# Patient Record
Sex: Female | Born: 1940
Health system: Southern US, Community
[De-identification: ages and names within clinical notes are randomized; demographics above are authoritative.]

## PROBLEM LIST (undated history)

## (undated) DIAGNOSIS — I10 Essential (primary) hypertension: Secondary | ICD-10-CM

## (undated) DIAGNOSIS — I471 Supraventricular tachycardia, unspecified: Secondary | ICD-10-CM

## (undated) DIAGNOSIS — R42 Dizziness and giddiness: Secondary | ICD-10-CM

## (undated) DIAGNOSIS — M1990 Unspecified osteoarthritis, unspecified site: Secondary | ICD-10-CM

## (undated) DIAGNOSIS — E079 Disorder of thyroid, unspecified: Secondary | ICD-10-CM

## (undated) DIAGNOSIS — Z9889 Other specified postprocedural states: Secondary | ICD-10-CM

## (undated) DIAGNOSIS — F329 Major depressive disorder, single episode, unspecified: Secondary | ICD-10-CM

## (undated) DIAGNOSIS — G459 Transient cerebral ischemic attack, unspecified: Secondary | ICD-10-CM

## (undated) DIAGNOSIS — F29 Unspecified psychosis not due to a substance or known physiological condition: Secondary | ICD-10-CM

## (undated) DIAGNOSIS — E785 Hyperlipidemia, unspecified: Secondary | ICD-10-CM

## (undated) DIAGNOSIS — K219 Gastro-esophageal reflux disease without esophagitis: Secondary | ICD-10-CM

## (undated) DIAGNOSIS — E119 Type 2 diabetes mellitus without complications: Secondary | ICD-10-CM

## (undated) HISTORY — DX: Unspecified psychosis not due to a substance or known physiological condition: F29

## (undated) HISTORY — PX: COLONOSCOPY: SHX174

## (undated) HISTORY — PX: TUBAL LIGATION: SHX77

## (undated) HISTORY — DX: Transient cerebral ischemic attack, unspecified: G45.9

## (undated) HISTORY — DX: Major depressive disorder, single episode, unspecified: F32.9

## (undated) HISTORY — PX: JOINT REPLACEMENT: SHX530

## (undated) HISTORY — PX: THYROIDECTOMY: SHX17

## (undated) HISTORY — PX: CATARACT EXTRACTION: SUR2

## (undated) HISTORY — DX: Gastro-esophageal reflux disease without esophagitis: K21.9

## (undated) HISTORY — PX: ROTATOR CUFF REPAIR: SHX139

## (undated) HISTORY — DX: Essential (primary) hypertension: I10

---

## 2009-08-27 ENCOUNTER — Encounter: Admission: RE | Admit: 2009-08-27 | Discharge: 2009-10-24 | Payer: Self-pay | Admitting: Family Medicine

## 2010-04-01 ENCOUNTER — Emergency Department (HOSPITAL_BASED_OUTPATIENT_CLINIC_OR_DEPARTMENT_OTHER)
Admission: EM | Admit: 2010-04-01 | Discharge: 2010-04-01 | Payer: Self-pay | Source: Home / Self Care | Admitting: Emergency Medicine

## 2011-05-14 ENCOUNTER — Emergency Department (INDEPENDENT_AMBULATORY_CARE_PROVIDER_SITE_OTHER): Payer: Medicare Other

## 2011-05-14 ENCOUNTER — Emergency Department (HOSPITAL_BASED_OUTPATIENT_CLINIC_OR_DEPARTMENT_OTHER)
Admission: EM | Admit: 2011-05-14 | Discharge: 2011-05-14 | Disposition: A | Discharge status: Home / Self Care | Payer: Medicare Other | Attending: Emergency Medicine | Admitting: Emergency Medicine

## 2011-05-14 ENCOUNTER — Encounter: Payer: Self-pay | Admitting: *Deleted

## 2011-05-14 ENCOUNTER — Other Ambulatory Visit: Payer: Self-pay

## 2011-05-14 DIAGNOSIS — E079 Disorder of thyroid, unspecified: Secondary | ICD-10-CM | POA: Insufficient documentation

## 2011-05-14 DIAGNOSIS — R42 Dizziness and giddiness: Secondary | ICD-10-CM | POA: Insufficient documentation

## 2011-05-14 DIAGNOSIS — Z79899 Other long term (current) drug therapy: Secondary | ICD-10-CM | POA: Insufficient documentation

## 2011-05-14 DIAGNOSIS — I10 Essential (primary) hypertension: Secondary | ICD-10-CM

## 2011-05-14 DIAGNOSIS — F430 Acute stress reaction: Secondary | ICD-10-CM

## 2011-05-14 HISTORY — DX: Essential (primary) hypertension: I10

## 2011-05-14 HISTORY — DX: Disorder of thyroid, unspecified: E07.9

## 2011-05-14 LAB — CBC
Hemoglobin: 13.8 g/dL (ref 12.0–15.0)
MCH: 30.6 pg (ref 26.0–34.0)
MCHC: 35.2 g/dL (ref 30.0–36.0)
MCV: 86.9 fL (ref 78.0–100.0)
Platelets: 195 10*3/uL (ref 150–400)

## 2011-05-14 LAB — DIFFERENTIAL
Basophils Relative: 1 % (ref 0–1)
Eosinophils Absolute: 0.1 10*3/uL (ref 0.0–0.7)
Eosinophils Relative: 2 % (ref 0–5)
Lymphs Abs: 1.2 10*3/uL (ref 0.7–4.0)
Monocytes Relative: 13 % — ABNORMAL HIGH (ref 3–12)
Neutrophils Relative %: 50 % (ref 43–77)

## 2011-05-14 LAB — BASIC METABOLIC PANEL
BUN: 14 mg/dL (ref 6–23)
Calcium: 9.7 mg/dL (ref 8.4–10.5)
GFR calc Af Amer: 85 mL/min — ABNORMAL LOW (ref >90)
GFR calc non Af Amer: 74 mL/min — ABNORMAL LOW (ref >90)
Glucose, Bld: 105 mg/dL — ABNORMAL HIGH (ref 70–99)
Potassium: 3.5 mEq/L (ref 3.5–5.1)

## 2011-05-14 NOTE — ED Notes (Signed)
Pt c/o increased bp x 3 weeks and states increased stress x 3 weeks also

## 2011-05-14 NOTE — ED Provider Notes (Signed)
History     CSN: [id number]  Arrival date & time 05/14/11  1732   First MD Initiated Contact with Patient 05/14/11 1835      Chief Complaint  Patient presents with  . Hypertension    (Consider location/radiation/quality/duration/timing/severity/associated sxs/prior treatment) Patient is a 70 y.o. female presenting with hypertension. The history is provided by the patient.  Hypertension This is a recurrent problem. Pertinent negatives include no chest pain, no abdominal pain, no headaches and no shortness of breath.   patient states her blood pressure has been high the last 3 weeks. She states she's had increased stress. She states that she feels dizzy when her blood pressure was up. No chest pain. No fevers. No numbness or weakness. No difficulty breathing. No swelling. No cough. No headache.  Past Medical History  Diagnosis Date  . Hypertension   . Thyroid disease     Past Surgical History  Procedure Date  . Joint replacement   . Thyroidectomy   . Tubal ligation     History reviewed. No pertinent family history.  History  Substance Use Topics  . Smoking status: Never Smoker   . Smokeless tobacco: Not on file  . Alcohol Use: No    OB History    Grav Para Term Preterm Abortions TAB SAB Ect Mult Living                  Review of Systems  Constitutional: Negative for activity change and appetite change.  HENT: Negative for neck stiffness.   Eyes: Negative for pain.  Respiratory: Negative for chest tightness and shortness of breath.   Cardiovascular: Negative for chest pain and leg swelling.  Gastrointestinal: Negative for nausea, vomiting, abdominal pain and diarrhea.  Genitourinary: Negative for flank pain.  Musculoskeletal: Negative for back pain.  Skin: Negative for rash.  Neurological: Positive for dizziness. Negative for weakness, numbness and headaches.  Psychiatric/Behavioral: Negative for behavioral problems.    Allergies  Review of patient's  allergies indicates no known allergies.  Home Medications   Current Outpatient Rx  Name Route Sig Dispense Refill  . ASPIRIN 81 MG PO TABS Oral Take 81 mg by mouth daily.      Marland Kitchen BISACODYL 5 MG PO TBEC Oral Take 5 mg by mouth daily as needed.      Marland Kitchen CARVEDILOL 25 MG PO TABS Oral Take 50 mg by mouth daily.      Marland Kitchen FAMOTIDINE 20 MG PO TABS Oral Take 20 mg by mouth 2 (two) times daily.      Marland Kitchen LEVOTHYROXINE SODIUM 150 MCG PO TABS Oral Take 150 mcg by mouth every morning.      Marland Kitchen LOSARTAN POTASSIUM-HCTZ 50-12.5 MG PO TABS Oral Take 1 tablet by mouth every morning.      Marland Kitchen OMEPRAZOLE MAGNESIUM 20 MG PO TBEC Oral Take 20 mg by mouth daily.      Marland Kitchen OVER THE COUNTER MEDICATION Oral Take 2 tablets by mouth daily. Pressur-Lo multivitamin, mineral, and herb supplement to support cardiovascular health     . POTASSIUM CHLORIDE 10 MEQ PO TBCR Oral Take 10 mEq by mouth 3 (three) times daily.      Marland Kitchen SOLIFENACIN SUCCINATE 5 MG PO TABS Oral Take 5 mg by mouth daily.      Marland Kitchen TAMSULOSIN HCL 0.4 MG PO CAPS Oral Take 0.4 mg by mouth daily.      Marland Kitchen ZOLPIDEM TARTRATE 5 MG PO TABS Oral Take 5 mg by mouth at bedtime as needed. For  sleep       BP 172/92  Pulse 73  Temp(Src) 98.2 F (36.8 C) (Oral)  Resp 20  Ht 5\' 3"  (1.6 m)  Wt 200 lb (90.719 kg)  BMI 35.43 kg/m2  SpO2 99%  Physical Exam  Nursing note and vitals reviewed. Constitutional: She is oriented to person, place, and time. She appears well-developed and well-nourished.  HENT:  Head: Normocephalic and atraumatic.  Eyes: EOM are normal. Pupils are equal, round, and reactive to light.  Neck: Normal range of motion. Neck supple.  Cardiovascular: Normal rate and regular rhythm.   Murmur heard.      Severe hypertension  Pulmonary/Chest: Effort normal and breath sounds normal. No respiratory distress. She has no wheezes. She has no rales.  Abdominal: Soft. Bowel sounds are normal. She exhibits no distension. There is no tenderness. There is no rebound and no  guarding.  Musculoskeletal: Normal range of motion.  Neurological: She is alert and oriented to person, place, and time. No cranial nerve deficit.  Skin: Skin is warm and dry.  Psychiatric: She has a normal mood and affect. Her speech is normal.    ED Course  Procedures (including critical care time)  Labs Reviewed  CBC - Abnormal; Notable for the following:    WBC 3.5 (*)    All other components within normal limits  DIFFERENTIAL - Abnormal; Notable for the following:    Monocytes Relative 13 (*)    All other components within normal limits  BASIC METABOLIC PANEL - Abnormal; Notable for the following:    Glucose, Bld 105 (*)    GFR calc non Af Amer 74 (*)    GFR calc Af Amer 85 (*)    All other components within normal limits  TROPONIN I   Dg Chest 2 View  05/14/2011  *RADIOLOGY REPORT*  Clinical Data: Complains of elevated blood pressure for 3 weeks. Increased stress for 3 weeks.  CHEST - 2 VIEW  Comparison: None  Findings: Tortuous and unfolded thoracic aorta noted.  Heart size is normal.  No pleural effusion or pulmonary edema.  Mild spondylosis within the thoracic spine noted.  IMPRESSION:  1.  No acute cardiopulmonary abnormalities.  Original Report Authenticated By: Rosealee Albee, M.D.     1. Hypertension      Date: 05/14/2011  Rate: 72  Rhythm: normal sinus rhythm  QRS Axis: normal  Intervals: normal  ST/T Wave abnormalities: normal  Conduction Disutrbances:none  Narrative Interpretation: LVH with qrs widening  Old EKG Reviewed: none available    MDM  Patient presents with dizziness and hypertension. Her blood pressure has improved somewhat here. She states she can follow with her cardiologist tomorrow. She does not have chest pain or EKG changes. She feels better once her blood pressures come down. She'll be discharged home.        Juliet Rude. Rubin Payor, MD 05/14/11 2233

## 2013-04-21 ENCOUNTER — Non-Acute Institutional Stay (SKILLED_NURSING_FACILITY): Payer: Medicare Other | Admitting: Internal Medicine

## 2013-04-21 DIAGNOSIS — E039 Hypothyroidism, unspecified: Secondary | ICD-10-CM

## 2013-04-21 DIAGNOSIS — G459 Transient cerebral ischemic attack, unspecified: Secondary | ICD-10-CM

## 2013-04-21 DIAGNOSIS — G219 Secondary parkinsonism, unspecified: Secondary | ICD-10-CM
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
## 2013-04-24 ENCOUNTER — Non-Acute Institutional Stay (SKILLED_NURSING_FACILITY): Payer: Medicare Other | Admitting: Internal Medicine

## 2013-04-24 DIAGNOSIS — I10 Essential (primary) hypertension: Secondary | ICD-10-CM

## 2013-04-24 DIAGNOSIS — Z79899 Other long term (current) drug therapy: Secondary | ICD-10-CM

## 2013-04-24 DIAGNOSIS — R635 Abnormal weight gain: Secondary | ICD-10-CM

## 2013-04-28 ENCOUNTER — Other Ambulatory Visit: Payer: Self-pay | Admitting: *Deleted

## 2013-04-28 MED ORDER — CLONAZEPAM 0.5 MG PO TABS: ORAL_TABLET | ORAL | Status: DC

## 2013-05-04 ENCOUNTER — Non-Acute Institutional Stay (SKILLED_NURSING_FACILITY): Payer: Medicare Other | Admitting: Adult Health

## 2013-05-04 ENCOUNTER — Encounter: Payer: Self-pay | Admitting: Adult Health

## 2013-05-04 DIAGNOSIS — F329 Major depressive disorder, single episode, unspecified: Secondary | ICD-10-CM

## 2013-05-04 DIAGNOSIS — F29 Unspecified psychosis not due to a substance or known physiological condition: Secondary | ICD-10-CM

## 2013-05-04 DIAGNOSIS — E039 Hypothyroidism, unspecified: Secondary | ICD-10-CM

## 2013-05-04 DIAGNOSIS — F32A Depression, unspecified: Secondary | ICD-10-CM

## 2013-05-04 DIAGNOSIS — K219 Gastro-esophageal reflux disease without esophagitis: Secondary | ICD-10-CM

## 2013-05-04 DIAGNOSIS — G459 Transient cerebral ischemic attack, unspecified: Secondary | ICD-10-CM

## 2013-05-04 DIAGNOSIS — I10 Essential (primary) hypertension: Secondary | ICD-10-CM

## 2013-05-04 HISTORY — DX: Depression, unspecified: F32.A

## 2013-05-04 HISTORY — DX: Essential (primary) hypertension: I10

## 2013-05-04 HISTORY — DX: Transient cerebral ischemic attack, unspecified: G45.9

## 2013-05-04 HISTORY — DX: Gastro-esophageal reflux disease without esophagitis: K21.9

## 2013-05-04 HISTORY — DX: Unspecified psychosis not due to a substance or known physiological condition: F29
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
## 2013-06-27 ENCOUNTER — Encounter: Payer: Self-pay | Admitting: Internal Medicine

## 2013-06-27 DIAGNOSIS — Z79899 Other long term (current) drug therapy: Secondary | ICD-10-CM | POA: Insufficient documentation

## 2013-06-27 DIAGNOSIS — R635 Abnormal weight gain: Secondary | ICD-10-CM | POA: Insufficient documentation

## 2013-06-27 NOTE — Progress Notes (Signed)
Patient ID: Cheryl Palmer, female   DOB: 1940-09-20, 73 y.o.   MRN: [id number]          PROGRESS NOTE  DATE: 04/24/2013    [facility]  LEVEL OF CARE: SNF (31)  Acute Visit  CHIEF COMPLAINT:  Manage weight gain.    HISTORY OF PRESENT ILLNESS: I was requested by the staff to assess the patient regarding above problem(s):  Staff report that patient has gained weight.  Her weight was 213.6 pounds at admission on 04/14/2013.  Today, weight is 223.4.  She has  chronic lower extremity swelling and she is complaining of increasing lower extremity swelling and dyspnea on exertion.  She is currently not on any diuretics.    PAST MEDICAL HISTORY : Reviewed.  No changes.  CURRENT [facility]S: [facility]  REVIEW OF SYSTEMS:  GENERAL: no change in appetite, no fatigue, no weight changes, no fever, chills or weakness RESPIRATORY: no cough, SOB, DOE,, wheezing, hemoptysis CARDIAC: no chest pain or palpitations; patient has  chronic lower extremity swelling    GI: no abdominal pain, diarrhea, constipation, heart burn, nausea or vomiting  PHYSICAL EXAMINATION  VS:  T 98.1       P 79      RR 16      BP 174/115     POX 96% room air        GENERAL: no acute distress, morbidly obese body habitus EYES: conjunctivae normal, sclerae normal, normal eye lids NECK: supple, trachea midline, no neck masses, no thyroid tenderness, no thyromegaly LYMPHATICS: no LAN in the neck, no supraclavicular LAN RESPIRATORY: breathing is even & unlabored, BS CTAB CARDIAC: RRR, no murmur,no extra heart sounds EDEMA/VARICOSITIES:  +3 bilateral lower extremity edema  ARTERIAL:  pedal pulses nonpalpable    GI: abdomen soft, normal BS, no masses, no tenderness, no hepatomegaly, no splenomegaly PSYCHIATRIC: the patient is alert & oriented to person, affect & behavior appropriate  ASSESSMENT/PLAN:  Weight gain.  New problem.  Likely secondary to worsening edema.  Start  Lasix 40 mg q.d.  CMP and TSH pending.    Hypertension.  Uncontrolled.  Again, we will address by the initiation of Lasix.    Check BMP in three days.    [facility] Metrics:   Aspirin 81 mg.  No tobacco use.    CPT CODE: [id number]

## 2016-02-03 ENCOUNTER — Emergency Department (HOSPITAL_BASED_OUTPATIENT_CLINIC_OR_DEPARTMENT_OTHER)
Admission: EM | Admit: 2016-02-03 | Discharge: 2016-02-03 | Disposition: A | Discharge status: Home / Self Care | Payer: Medicare HMO | Attending: Emergency Medicine | Admitting: Emergency Medicine

## 2016-02-03 ENCOUNTER — Encounter (HOSPITAL_BASED_OUTPATIENT_CLINIC_OR_DEPARTMENT_OTHER): Payer: Self-pay | Admitting: Emergency Medicine

## 2016-02-03 ENCOUNTER — Emergency Department (HOSPITAL_BASED_OUTPATIENT_CLINIC_OR_DEPARTMENT_OTHER): Payer: Medicare HMO

## 2016-02-03 DIAGNOSIS — Z7982 Long term (current) use of aspirin: Secondary | ICD-10-CM | POA: Diagnosis not present

## 2016-02-03 DIAGNOSIS — I10 Essential (primary) hypertension: Secondary | ICD-10-CM | POA: Diagnosis present

## 2016-02-03 DIAGNOSIS — Z79899 Other long term (current) drug therapy: Secondary | ICD-10-CM | POA: Diagnosis not present

## 2016-02-03 DIAGNOSIS — R009 Unspecified abnormalities of heart beat: Secondary | ICD-10-CM | POA: Diagnosis not present

## 2016-02-03 DIAGNOSIS — E039 Hypothyroidism, unspecified: Secondary | ICD-10-CM | POA: Insufficient documentation

## 2016-02-03 DIAGNOSIS — R002 Palpitations: Secondary | ICD-10-CM

## 2016-02-03 LAB — CBC WITH DIFFERENTIAL/PLATELET
Basophils Absolute: 0 10*3/uL (ref 0.0–0.1)
Basophils Relative: 0 %
Eosinophils Absolute: 0.1 10*3/uL (ref 0.0–0.7)
Eosinophils Relative: 1 %
HEMATOCRIT: 38.9 % (ref 36.0–46.0)
HEMOGLOBIN: 13.6 g/dL (ref 12.0–15.0)
LYMPHS ABS: 1.3 10*3/uL (ref 0.7–4.0)
LYMPHS PCT: 30 %
MCH: 31.4 pg (ref 26.0–34.0)
MCHC: 35 g/dL (ref 30.0–36.0)
MCV: 89.8 fL (ref 78.0–100.0)
MONOS PCT: 7 %
Monocytes Absolute: 0.3 10*3/uL (ref 0.1–1.0)
NEUTROS ABS: 2.5 10*3/uL (ref 1.7–7.7)
NEUTROS PCT: 62 %
Platelets: 172 10*3/uL (ref 150–400)
RBC: 4.33 MIL/uL (ref 3.87–5.11)
RDW: 15.7 % — ABNORMAL HIGH (ref 11.5–15.5)
WBC: 4.2 10*3/uL (ref 4.0–10.5)

## 2016-02-03 LAB — BASIC METABOLIC PANEL
Anion gap: 8 (ref 5–15)
BUN: 15 mg/dL (ref 6–20)
CHLORIDE: 104 mmol/L (ref 101–111)
CO2: 25 mmol/L (ref 22–32)
CREATININE: 1.25 mg/dL — AB (ref 0.44–1.00)
Calcium: 9.1 mg/dL (ref 8.9–10.3)
GFR calc Af Amer: 48 mL/min — ABNORMAL LOW (ref >60)
GFR calc non Af Amer: 42 mL/min — ABNORMAL LOW (ref >60)
Glucose, Bld: 90 mg/dL (ref 65–99)
POTASSIUM: 3.5 mmol/L (ref 3.5–5.1)
Sodium: 137 mmol/L (ref 135–145)

## 2016-02-03 LAB — TROPONIN I
Troponin I: <0.03 ng/mL (ref <0.03)
Troponin I: <0.03 ng/mL (ref <0.03)

## 2016-02-03 MED ORDER — ASPIRIN 81 MG PO CHEW: 324.0000 mg | CHEWABLE_TABLET | Freq: Once | ORAL | Status: DC

## 2016-02-03 NOTE — ED Notes (Signed)
Patient transported to X-ray 

## 2016-02-03 NOTE — Discharge Instructions (Addendum)
Please call your primary care doctor for recheck on your symptoms.  Return without fail for worsening symptoms, including severe chest pain, difficulty breathing, passing out, or any other symptoms concerning to you.

## 2016-02-03 NOTE — ED Notes (Signed)
Pt states recent adm to [place] [facility] for increased BP and CP, stress test neg, new unknown Bp med added , today of c/o increased BP

## 2016-02-03 NOTE — ED Notes (Signed)
[facility] [place] states new BP med is norvasc 5 mg
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
## 2016-02-03 NOTE — ED Triage Notes (Signed)
Patient reports that she took her BP like normal today and found that he Blood pressure was hihg. The patient also states that she is having a "funny" feeling in her chest

## 2016-11-03 ENCOUNTER — Emergency Department (HOSPITAL_BASED_OUTPATIENT_CLINIC_OR_DEPARTMENT_OTHER)
Admission: EM | Admit: 2016-11-03 | Discharge: 2016-11-03 | Disposition: A | Discharge status: Home / Self Care | Payer: Medicare HMO | Attending: Physician Assistant | Admitting: Physician Assistant

## 2016-11-03 ENCOUNTER — Encounter (HOSPITAL_BASED_OUTPATIENT_CLINIC_OR_DEPARTMENT_OTHER): Payer: Self-pay | Admitting: Emergency Medicine

## 2016-11-03 DIAGNOSIS — Z8673 Personal history of transient ischemic attack (TIA), and cerebral infarction without residual deficits: Secondary | ICD-10-CM | POA: Insufficient documentation

## 2016-11-03 DIAGNOSIS — F329 Major depressive disorder, single episode, unspecified: Secondary | ICD-10-CM | POA: Insufficient documentation

## 2016-11-03 DIAGNOSIS — I10 Essential (primary) hypertension: Secondary | ICD-10-CM | POA: Insufficient documentation

## 2016-11-03 DIAGNOSIS — Z79899 Other long term (current) drug therapy: Secondary | ICD-10-CM | POA: Insufficient documentation

## 2016-11-03 DIAGNOSIS — Z7982 Long term (current) use of aspirin: Secondary | ICD-10-CM | POA: Insufficient documentation

## 2016-11-03 DIAGNOSIS — E039 Hypothyroidism, unspecified: Secondary | ICD-10-CM | POA: Diagnosis not present

## 2016-11-03 LAB — URINALYSIS, ROUTINE W REFLEX MICROSCOPIC
Bilirubin Urine: NEGATIVE
GLUCOSE, UA: NEGATIVE mg/dL
HGB URINE DIPSTICK: NEGATIVE
Ketones, ur: NEGATIVE mg/dL
Nitrite: NEGATIVE
PROTEIN: NEGATIVE mg/dL
Specific Gravity, Urine: 1.005 (ref 1.005–1.030)
pH: 6.5 (ref 5.0–8.0)

## 2016-11-03 LAB — URINALYSIS, MICROSCOPIC (REFLEX)

## 2016-11-03 NOTE — Discharge Instructions (Signed)
Please follow up with your primary care physician. You should keep a record of your blood pressures.  Please return immediately with any chset pain or headache or other concerns.

## 2016-11-03 NOTE — ED Triage Notes (Signed)
Patient states that she takes her BP every night. Tonight it read 166 / 144 at home. Denies any complaints with the elevated BP - no distress noted at this time
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
## 2016-11-22 ENCOUNTER — Emergency Department (HOSPITAL_BASED_OUTPATIENT_CLINIC_OR_DEPARTMENT_OTHER)
Admission: EM | Admit: 2016-11-22 | Discharge: 2016-11-22 | Disposition: A | Discharge status: Home / Self Care | Payer: Medicare HMO | Attending: Emergency Medicine | Admitting: Emergency Medicine

## 2016-11-22 ENCOUNTER — Encounter (HOSPITAL_BASED_OUTPATIENT_CLINIC_OR_DEPARTMENT_OTHER): Payer: Self-pay | Admitting: Emergency Medicine

## 2016-11-22 DIAGNOSIS — I10 Essential (primary) hypertension: Secondary | ICD-10-CM | POA: Diagnosis not present

## 2016-11-22 DIAGNOSIS — E039 Hypothyroidism, unspecified: Secondary | ICD-10-CM | POA: Diagnosis not present

## 2016-11-22 DIAGNOSIS — Z7982 Long term (current) use of aspirin: Secondary | ICD-10-CM | POA: Insufficient documentation

## 2016-11-22 DIAGNOSIS — Z79899 Other long term (current) drug therapy: Secondary | ICD-10-CM | POA: Insufficient documentation

## 2016-11-22 LAB — COMPREHENSIVE METABOLIC PANEL
ALT: 39 U/L (ref 14–54)
AST: 54 U/L — AB (ref 15–41)
Albumin: 3.9 g/dL (ref 3.5–5.0)
Alkaline Phosphatase: 68 U/L (ref 38–126)
Anion gap: 9 (ref 5–15)
BUN: 12 mg/dL (ref 6–20)
CHLORIDE: 102 mmol/L (ref 101–111)
CO2: 28 mmol/L (ref 22–32)
CREATININE: 0.95 mg/dL (ref 0.44–1.00)
Calcium: 9.1 mg/dL (ref 8.9–10.3)
GFR calc non Af Amer: 58 mL/min — ABNORMAL LOW (ref >60)
Glucose, Bld: 99 mg/dL (ref 65–99)
POTASSIUM: 3.8 mmol/L (ref 3.5–5.1)
SODIUM: 139 mmol/L (ref 135–145)
Total Bilirubin: 0.8 mg/dL (ref 0.3–1.2)
Total Protein: 7.6 g/dL (ref 6.5–8.1)

## 2016-11-22 LAB — URINALYSIS, ROUTINE W REFLEX MICROSCOPIC
BILIRUBIN URINE: NEGATIVE
Glucose, UA: NEGATIVE mg/dL
Hgb urine dipstick: NEGATIVE
KETONES UR: NEGATIVE mg/dL
NITRITE: NEGATIVE
PROTEIN: NEGATIVE mg/dL
Specific Gravity, Urine: 1.004 — ABNORMAL LOW (ref 1.005–1.030)
pH: 6 (ref 5.0–8.0)

## 2016-11-22 LAB — CBC WITH DIFFERENTIAL/PLATELET
BASOS ABS: 0 10*3/uL (ref 0.0–0.1)
Basophils Relative: 1 %
EOS ABS: 0.1 10*3/uL (ref 0.0–0.7)
EOS PCT: 2 %
HCT: 38.4 % (ref 36.0–46.0)
Hemoglobin: 13.5 g/dL (ref 12.0–15.0)
Lymphocytes Relative: 33 %
Lymphs Abs: 1.1 10*3/uL (ref 0.7–4.0)
MCH: 31.7 pg (ref 26.0–34.0)
MCHC: 35.2 g/dL (ref 30.0–36.0)
MCV: 90.1 fL (ref 78.0–100.0)
Monocytes Absolute: 0.4 10*3/uL (ref 0.1–1.0)
Monocytes Relative: 13 %
Neutro Abs: 1.8 10*3/uL (ref 1.7–7.7)
Neutrophils Relative %: 51 %
PLATELETS: 150 10*3/uL (ref 150–400)
RBC: 4.26 MIL/uL (ref 3.87–5.11)
RDW: 14.9 % (ref 11.5–15.5)
WBC: 3.5 10*3/uL — AB (ref 4.0–10.5)

## 2016-11-22 LAB — URINALYSIS, MICROSCOPIC (REFLEX): RBC / HPF: NONE SEEN RBC/hpf (ref 0–5)

## 2016-11-22 LAB — TROPONIN I

## 2016-11-22 MED ORDER — AMLODIPINE BESYLATE 10 MG PO TABS: 10.0000 mg | ORAL_TABLET | Freq: Every day | ORAL | 0 refills | Status: DC

## 2016-11-22 NOTE — ED Notes (Signed)
Attempted IV x 2 , tol well, unsuccessful 

## 2016-11-22 NOTE — ED Notes (Signed)
Attempted IV x 2, per Arline Aspindy RN, unsuccessful , tol well

## 2016-11-22 NOTE — ED Triage Notes (Signed)
Patient states that for the last 2 days her BP has been elevated at home - fluctuating with a high diastolic. Patient denies any complaints, reports that she takes her BP daily

## 2016-11-22 NOTE — ED Notes (Signed)
Given juice and graham crackers 
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
## 2016-11-22 NOTE — Discharge Instructions (Addendum)
Increase norvasc to 10 mg daily  

## 2017-05-16 ENCOUNTER — Other Ambulatory Visit: Payer: Self-pay

## 2017-05-16 ENCOUNTER — Encounter (HOSPITAL_BASED_OUTPATIENT_CLINIC_OR_DEPARTMENT_OTHER): Payer: Self-pay | Admitting: Emergency Medicine

## 2017-05-16 ENCOUNTER — Emergency Department (HOSPITAL_BASED_OUTPATIENT_CLINIC_OR_DEPARTMENT_OTHER)
Admission: EM | Admit: 2017-05-16 | Discharge: 2017-05-16 | Disposition: A | Discharge status: Home / Self Care | Payer: Medicare HMO | Attending: Emergency Medicine | Admitting: Emergency Medicine

## 2017-05-16 DIAGNOSIS — I10 Essential (primary) hypertension: Secondary | ICD-10-CM | POA: Insufficient documentation

## 2017-05-16 DIAGNOSIS — E039 Hypothyroidism, unspecified: Secondary | ICD-10-CM | POA: Diagnosis not present

## 2017-05-16 DIAGNOSIS — R300 Dysuria: Secondary | ICD-10-CM | POA: Diagnosis not present

## 2017-05-16 DIAGNOSIS — Z8673 Personal history of transient ischemic attack (TIA), and cerebral infarction without residual deficits: Secondary | ICD-10-CM | POA: Diagnosis not present

## 2017-05-16 LAB — URINALYSIS, MICROSCOPIC (REFLEX): RBC / HPF: NONE SEEN RBC/hpf (ref 0–5)

## 2017-05-16 LAB — URINALYSIS, ROUTINE W REFLEX MICROSCOPIC
Bilirubin Urine: NEGATIVE
Glucose, UA: NEGATIVE mg/dL
Hgb urine dipstick: NEGATIVE
Ketones, ur: NEGATIVE mg/dL
NITRITE: NEGATIVE
PROTEIN: NEGATIVE mg/dL
pH: 6 (ref 5.0–8.0)

## 2017-05-16 MED ORDER — CEPHALEXIN 500 MG PO CAPS: 500.0000 mg | ORAL_CAPSULE | Freq: Three times a day (TID) | ORAL | 0 refills | Status: AC
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
## 2017-05-16 NOTE — Discharge Instructions (Signed)

## 2017-05-16 NOTE — ED Triage Notes (Signed)
Pt c/o dysuria x 1 wk

## 2017-05-17 LAB — URINE CULTURE: Culture: NO GROWTH

## 2017-05-27 ENCOUNTER — Emergency Department (HOSPITAL_BASED_OUTPATIENT_CLINIC_OR_DEPARTMENT_OTHER)
Admission: EM | Admit: 2017-05-27 | Discharge: 2017-05-27 | Disposition: A | Discharge status: Home / Self Care | Payer: Medicare HMO | Attending: Emergency Medicine | Admitting: Emergency Medicine

## 2017-05-27 ENCOUNTER — Emergency Department (HOSPITAL_BASED_OUTPATIENT_CLINIC_OR_DEPARTMENT_OTHER): Payer: Medicare HMO

## 2017-05-27 ENCOUNTER — Other Ambulatory Visit: Payer: Self-pay

## 2017-05-27 ENCOUNTER — Encounter (HOSPITAL_BASED_OUTPATIENT_CLINIC_OR_DEPARTMENT_OTHER): Payer: Self-pay | Admitting: *Deleted

## 2017-05-27 DIAGNOSIS — Z79899 Other long term (current) drug therapy: Secondary | ICD-10-CM | POA: Insufficient documentation

## 2017-05-27 DIAGNOSIS — Z7982 Long term (current) use of aspirin: Secondary | ICD-10-CM | POA: Insufficient documentation

## 2017-05-27 DIAGNOSIS — W228XXA Striking against or struck by other objects, initial encounter: Secondary | ICD-10-CM | POA: Insufficient documentation

## 2017-05-27 DIAGNOSIS — I10 Essential (primary) hypertension: Secondary | ICD-10-CM | POA: Diagnosis not present

## 2017-05-27 DIAGNOSIS — E039 Hypothyroidism, unspecified: Secondary | ICD-10-CM | POA: Diagnosis not present

## 2017-05-27 DIAGNOSIS — Y9389 Activity, other specified: Secondary | ICD-10-CM | POA: Diagnosis not present

## 2017-05-27 DIAGNOSIS — S0083XA Contusion of other part of head, initial encounter: Secondary | ICD-10-CM | POA: Diagnosis not present

## 2017-05-27 DIAGNOSIS — S0990XA Unspecified injury of head, initial encounter: Secondary | ICD-10-CM | POA: Diagnosis present

## 2017-05-27 DIAGNOSIS — Y929 Unspecified place or not applicable: Secondary | ICD-10-CM | POA: Diagnosis not present

## 2017-05-27 DIAGNOSIS — Y999 Unspecified external cause status: Secondary | ICD-10-CM | POA: Diagnosis not present

## 2017-05-27 DIAGNOSIS — W19XXXA Unspecified fall, initial encounter: Secondary | ICD-10-CM

## 2017-05-27 NOTE — Discharge Instructions (Signed)
Take tylenol as needed for pain.  Use ice packs to help control pain and swelling. Follow-up with your primary care doctor next week for further evaluation of your symptoms. Return to the emergency room if you develop confusion, vision changes, slurred speech, weakness, or any new or concerning symptoms.

## 2017-05-27 NOTE — ED Triage Notes (Signed)
She tripped and fell today. Facial injury. Redness above her upper lip and nose. States she hit the right side of her head. No hematoma palpated. No LOC.
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
## 2017-05-27 NOTE — ED Provider Notes (Signed)
Medical screening examination/treatment/procedure(s) were conducted as a shared visit with non-physician practitioner(s) and myself.  I personally evaluated the patient during the encounter.   EKG Interpretation None       Results for orders placed or performed during the hospital encounter of 05/16/17  Urine culture  Result Value Ref Range   Specimen Description URINE, CLEAN CATCH    Special Requests NONE    Culture      NO GROWTH Performed at [facility], [address], [place], [place] [address]    Report Status 05/17/2017 FINAL   Urinalysis, Routine w reflex microscopic  Result Value Ref Range   Color, Urine STRAW (A) YELLOW   APPearance CLEAR CLEAR   Specific Gravity, Urine <1.005 (L) 1.005 - 1.030   pH 6.0 5.0 - 8.0   Glucose, UA NEGATIVE NEGATIVE mg/dL   Hgb urine dipstick NEGATIVE NEGATIVE   Bilirubin Urine NEGATIVE NEGATIVE   Ketones, ur NEGATIVE NEGATIVE mg/dL   Protein, ur NEGATIVE NEGATIVE mg/dL   Nitrite NEGATIVE NEGATIVE   Leukocytes, UA SMALL (A) NEGATIVE  Urinalysis, Microscopic (reflex)  Result Value Ref Range   RBC / HPF NONE SEEN 0 - 5 RBC/hpf   WBC, UA 0-5 0 - 5 WBC/hpf   Bacteria, UA FEW (A) NONE SEEN   Squamous Epithelial / LPF 0-5 (A) NONE SEEN   Hyaline Casts, UA PRESENT    Ct Head Wo Contrast  Result Date: 05/27/2017 CLINICAL DATA:  77 year old female with history of fall in school parking lot today with injury to the head and face complaining of left facial pain and pain in the back of the head. No associated loss of consciousness. EXAM: CT HEAD WITHOUT CONTRAST CT MAXILLOFACIAL WITHOUT CONTRAST CT CERVICAL SPINE WITHOUT CONTRAST TECHNIQUE: Multidetector CT imaging of the head, cervical spine, and maxillofacial structures were performed using the standard protocol without intravenous contrast. Multiplanar CT image reconstructions of the cervical spine and maxillofacial structures were also generated. COMPARISON:  Head CT 09/15/2015. FINDINGS:  CT HEAD FINDINGS Brain: Patchy and confluent areas of decreased attenuation are noted throughout the deep and periventricular white matter of the cerebral hemispheres bilaterally, compatible with chronic microvascular ischemic disease. No evidence of acute infarction, hemorrhage, hydrocephalus, extra-axial collection or mass lesion/mass effect. Vascular: No hyperdense vessel or unexpected calcification. Skull: Normal. Negative for fracture or focal lesion. Other: None. CT MAXILLOFACIAL FINDINGS Osseous: No fracture or mandibular dislocation. No destructive process. Orbits: Negative. No traumatic or inflammatory finding. Sinuses: Clear. Soft tissues: Negative. CT CERVICAL SPINE FINDINGS Alignment: Normal. Skull base and vertebrae: No acute fracture. No primary bone lesion or focal pathologic process. Soft tissues and spinal canal: No prevertebral fluid or swelling. No visible canal hematoma. Disc levels: Multilevel degenerative disc disease, most severe at C5-C6, C6-C7 and C7-T1. Mild multilevel facet arthropathy. Upper chest: Unremarkable. Other: None. IMPRESSION: 1. No evidence of significant acute traumatic injury to the skull, brain, facial bones or cervical spine. 2. Chronic microvascular ischemic changes in the cerebral white matter redemonstrated, as above. 3. Mild multilevel degenerative disc disease and cervical spondylosis, as above. Electronically Signed   By: Trudie Reed M.D.   On: 05/27/2017 17:16   Ct Cervical Spine Wo Contrast  Result Date: 05/27/2017 CLINICAL DATA:  77 year old female with history of fall in school parking lot today with injury to the head and face complaining of left facial pain and pain in the back of the head. No associated loss of consciousness. EXAM: CT HEAD WITHOUT CONTRAST CT MAXILLOFACIAL WITHOUT  CONTRAST CT CERVICAL SPINE WITHOUT CONTRAST TECHNIQUE: Multidetector CT imaging of the head, cervical spine, and maxillofacial structures were performed using the standard  protocol without intravenous contrast. Multiplanar CT image reconstructions of the cervical spine and maxillofacial structures were also generated. COMPARISON:  Head CT 09/15/2015. FINDINGS: CT HEAD FINDINGS Brain: Patchy and confluent areas of decreased attenuation are noted throughout the deep and periventricular white matter of the cerebral hemispheres bilaterally, compatible with chronic microvascular ischemic disease. No evidence of acute infarction, hemorrhage, hydrocephalus, extra-axial collection or mass lesion/mass effect. Vascular: No hyperdense vessel or unexpected calcification. Skull: Normal. Negative for fracture or focal lesion. Other: None. CT MAXILLOFACIAL FINDINGS Osseous: No fracture or mandibular dislocation. No destructive process. Orbits: Negative. No traumatic or inflammatory finding. Sinuses: Clear. Soft tissues: Negative. CT CERVICAL SPINE FINDINGS Alignment: Normal. Skull base and vertebrae: No acute fracture. No primary bone lesion or focal pathologic process. Soft tissues and spinal canal: No prevertebral fluid or swelling. No visible canal hematoma. Disc levels: Multilevel degenerative disc disease, most severe at C5-C6, C6-C7 and C7-T1. Mild multilevel facet arthropathy. Upper chest: Unremarkable. Other: None. IMPRESSION: 1. No evidence of significant acute traumatic injury to the skull, brain, facial bones or cervical spine. 2. Chronic microvascular ischemic changes in the cerebral white matter redemonstrated, as above. 3. Mild multilevel degenerative disc disease and cervical spondylosis, as above. Electronically Signed   By: Trudie Reedaniel  Entrikin M.D.   On: 05/27/2017 17:16   Dg Knee Complete 4 Views Right  Result Date: 05/27/2017 CLINICAL DATA:  RIGHT knee pain post fall today EXAM: RIGHT KNEE - COMPLETE 4+ VIEW COMPARISON:  05/07/2013 FINDINGS: Osseous demineralization. Tricompartmental osteoarthritic changes with joint space narrowing and spur formation. No acute fracture,  dislocation, or bone destruction. No knee joint effusion. Soft tissue swelling anteriorly at and below the knee. 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 No definite acute bony abnormalities. Electronically Signed   By: Ulyses SouthwardMark  Boles M.D.   On: 05/27/2017 17:29   Ct Maxillofacial Wo Contrast  Result Date: 05/27/2017 CLINICAL DATA:  77 year old female with history of fall in school parking lot today with injury to the head and face complaining of left facial pain and pain in the back of the head. No associated loss of consciousness. EXAM: CT HEAD WITHOUT CONTRAST CT MAXILLOFACIAL WITHOUT CONTRAST CT CERVICAL SPINE WITHOUT CONTRAST TECHNIQUE: Multidetector CT imaging of the head, cervical spine, and maxillofacial structures were performed using the standard protocol without intravenous contrast. Multiplanar CT image reconstructions of the cervical spine and maxillofacial structures were also generated. COMPARISON:  Head CT 09/15/2015. FINDINGS: CT HEAD FINDINGS Brain: Patchy and confluent areas of decreased attenuation are noted throughout the deep and periventricular white matter of the cerebral hemispheres bilaterally, compatible with chronic microvascular ischemic disease. No evidence of acute infarction, hemorrhage, hydrocephalus, extra-axial collection or mass lesion/mass effect. Vascular: No hyperdense vessel or unexpected calcification. Skull: Normal. Negative for fracture or focal lesion. Other: None. CT MAXILLOFACIAL FINDINGS Osseous: No fracture or mandibular dislocation. No destructive process. Orbits: Negative. No traumatic or inflammatory finding. Sinuses: Clear. Soft tissues: Negative. CT CERVICAL SPINE FINDINGS Alignment: Normal. Skull base and vertebrae: No acute fracture. No primary bone lesion or focal pathologic process. Soft tissues and spinal canal: No prevertebral fluid or swelling. No visible canal hematoma. Disc levels: Multilevel degenerative disc  disease, most severe at C5-C6, C6-C7 and C7-T1. Mild multilevel facet arthropathy. Upper chest: Unremarkable. Other: None. IMPRESSION: 1. No evidence of significant acute traumatic injury to the skull, brain, facial bones or  cervical spine. 2. Chronic microvascular ischemic changes in the cerebral white matter redemonstrated, as above. 3. Mild multilevel degenerative disc disease and cervical spondylosis, as above. Electronically Signed   By: Trudie Reed M.D.   On: 05/27/2017 17:16    Patient seen by me along with physician assistant.  Patient is 77 year old female patient tripped and fell today striking the left side of her face and head.  Patient also has some swelling and redness to her upper lip and nose.  States her teeth are okay.  Also with a complaint of right knee pain.  No other significant complaints.  No loss of consciousness.  Patient CT head face and neck without any acute findings.  X-rays of right knee without any acute findings.  Patient is alert.  No significant neurovascular findings.  Patient does have swelling to her upper lip mostly on the left side.  Otherwise no significant physical exam findings.   Vanetta Mulders, MD 05/27/17 [phone]

## 2017-07-13 ENCOUNTER — Emergency Department (HOSPITAL_BASED_OUTPATIENT_CLINIC_OR_DEPARTMENT_OTHER)
Admission: EM | Admit: 2017-07-13 | Discharge: 2017-07-13 | Disposition: A | Discharge status: Home / Self Care | Payer: Medicare HMO | Attending: Emergency Medicine | Admitting: Emergency Medicine

## 2017-07-13 ENCOUNTER — Emergency Department (HOSPITAL_BASED_OUTPATIENT_CLINIC_OR_DEPARTMENT_OTHER): Payer: Medicare HMO

## 2017-07-13 ENCOUNTER — Other Ambulatory Visit: Payer: Self-pay

## 2017-07-13 ENCOUNTER — Encounter (HOSPITAL_BASED_OUTPATIENT_CLINIC_OR_DEPARTMENT_OTHER): Payer: Self-pay | Admitting: *Deleted

## 2017-07-13 DIAGNOSIS — Y929 Unspecified place or not applicable: Secondary | ICD-10-CM | POA: Insufficient documentation

## 2017-07-13 DIAGNOSIS — Z7982 Long term (current) use of aspirin: Secondary | ICD-10-CM | POA: Diagnosis not present

## 2017-07-13 DIAGNOSIS — Z7901 Long term (current) use of anticoagulants: Secondary | ICD-10-CM | POA: Insufficient documentation

## 2017-07-13 DIAGNOSIS — X58XXXA Exposure to other specified factors, initial encounter: Secondary | ICD-10-CM | POA: Diagnosis not present

## 2017-07-13 DIAGNOSIS — Z79899 Other long term (current) drug therapy: Secondary | ICD-10-CM | POA: Diagnosis not present

## 2017-07-13 DIAGNOSIS — S199XXA Unspecified injury of neck, initial encounter: Secondary | ICD-10-CM | POA: Diagnosis present

## 2017-07-13 DIAGNOSIS — I10 Essential (primary) hypertension: Secondary | ICD-10-CM | POA: Insufficient documentation

## 2017-07-13 DIAGNOSIS — Y999 Unspecified external cause status: Secondary | ICD-10-CM | POA: Diagnosis not present

## 2017-07-13 DIAGNOSIS — Z8673 Personal history of transient ischemic attack (TIA), and cerebral infarction without residual deficits: Secondary | ICD-10-CM | POA: Diagnosis not present

## 2017-07-13 DIAGNOSIS — S161XXA Strain of muscle, fascia and tendon at neck level, initial encounter: Secondary | ICD-10-CM | POA: Insufficient documentation

## 2017-07-13 DIAGNOSIS — M19019 Primary osteoarthritis, unspecified shoulder: Secondary | ICD-10-CM

## 2017-07-13 DIAGNOSIS — E039 Hypothyroidism, unspecified: Secondary | ICD-10-CM | POA: Diagnosis not present

## 2017-07-13 DIAGNOSIS — Y939 Activity, unspecified: Secondary | ICD-10-CM | POA: Insufficient documentation

## 2017-07-13 LAB — CBC WITH DIFFERENTIAL/PLATELET
Basophils Absolute: 0 10*3/uL (ref 0.0–0.1)
Basophils Relative: 1 %
EOS PCT: 3 %
Eosinophils Absolute: 0.1 10*3/uL (ref 0.0–0.7)
HCT: 40.3 % (ref 36.0–46.0)
HEMOGLOBIN: 13.7 g/dL (ref 12.0–15.0)
LYMPHS ABS: 1.1 10*3/uL (ref 0.7–4.0)
LYMPHS PCT: 37 %
MCH: 30.9 pg (ref 26.0–34.0)
MCHC: 34 g/dL (ref 30.0–36.0)
MCV: 91 fL (ref 78.0–100.0)
MONOS PCT: 11 %
Monocytes Absolute: 0.3 10*3/uL (ref 0.1–1.0)
NEUTROS PCT: 48 %
Neutro Abs: 1.5 10*3/uL — ABNORMAL LOW (ref 1.7–7.7)
Platelets: 188 10*3/uL (ref 150–400)
RBC: 4.43 MIL/uL (ref 3.87–5.11)
RDW: 15.7 % — ABNORMAL HIGH (ref 11.5–15.5)
WBC: 3.1 10*3/uL — AB (ref 4.0–10.5)

## 2017-07-13 LAB — TROPONIN I

## 2017-07-13 LAB — COMPREHENSIVE METABOLIC PANEL
ALK PHOS: 70 U/L (ref 38–126)
ALT: 15 U/L (ref 14–54)
AST: 23 U/L (ref 15–41)
Albumin: 4 g/dL (ref 3.5–5.0)
Anion gap: 6 (ref 5–15)
BUN: 16 mg/dL (ref 6–20)
CALCIUM: 8.8 mg/dL — AB (ref 8.9–10.3)
CO2: 28 mmol/L (ref 22–32)
CREATININE: 0.92 mg/dL (ref 0.44–1.00)
Chloride: 103 mmol/L (ref 101–111)
GFR, EST NON AFRICAN AMERICAN: 59 mL/min — AB (ref >60)
Glucose, Bld: 88 mg/dL (ref 65–99)
Potassium: 3.6 mmol/L (ref 3.5–5.1)
Sodium: 137 mmol/L (ref 135–145)
TOTAL PROTEIN: 7.9 g/dL (ref 6.5–8.1)
Total Bilirubin: 0.6 mg/dL (ref 0.3–1.2)

## 2017-07-13 LAB — PROTIME-INR
INR: 2.25
PROTHROMBIN TIME: 24.7 s — AB (ref 11.4–15.2)

## 2017-07-13 MED ORDER — CYCLOBENZAPRINE HCL 5 MG PO TABS: 5.0000 mg | ORAL_TABLET | Freq: Three times a day (TID) | ORAL | 0 refills | Status: DC | PRN

## 2017-07-13 MED ORDER — IOPAMIDOL (ISOVUE-370) INJECTION 76%
100.0000 mL | Freq: Once | INTRAVENOUS | Status: AC | PRN
  Administered 2017-07-13: 80 mL via INTRAVENOUS

## 2017-07-13 NOTE — ED Notes (Signed)
ED Provider at bedside. 

## 2017-07-13 NOTE — ED Triage Notes (Signed)
Pain in her neck and shoulder for over a month. Pain before she was told she has a blood clot a month ago. She was never told where the blood clot is located. She was started on a blood thinner a week ago.

## 2017-07-13 NOTE — Discharge Instructions (Addendum)
Continue tylenol for pain.   Take flexeril for muscle strain.   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 Follow up with your orthopedic doctor  Talk to you primary care doctor regarding why you are on xarelto. Continue xarelto until you find out why   See your doctor  Return to ER if you have worse neck pain, shoulder pain, chest pain, trouble breathing, uncontrolled bleeding, blood in stool
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
## 2017-07-13 NOTE — ED Notes (Signed)
Patient transported to CT 

## 2017-07-21 MED FILL — CYCLOBENZAPRINE 5 MG TABLET: 5 | 4 days supply | Qty: 10 | Fill #0

## 2017-08-31 ENCOUNTER — Encounter (HOSPITAL_BASED_OUTPATIENT_CLINIC_OR_DEPARTMENT_OTHER): Payer: Self-pay | Admitting: *Deleted

## 2017-08-31 ENCOUNTER — Other Ambulatory Visit: Payer: Self-pay

## 2017-08-31 ENCOUNTER — Emergency Department (HOSPITAL_BASED_OUTPATIENT_CLINIC_OR_DEPARTMENT_OTHER)
Admission: EM | Admit: 2017-08-31 | Discharge: 2017-08-31 | Disposition: A | Discharge status: Home / Self Care | Payer: Medicare HMO | Attending: Emergency Medicine | Admitting: Emergency Medicine

## 2017-08-31 DIAGNOSIS — Z79899 Other long term (current) drug therapy: Secondary | ICD-10-CM | POA: Insufficient documentation

## 2017-08-31 DIAGNOSIS — I959 Hypotension, unspecified: Secondary | ICD-10-CM

## 2017-08-31 DIAGNOSIS — Z7982 Long term (current) use of aspirin: Secondary | ICD-10-CM | POA: Insufficient documentation

## 2017-08-31 DIAGNOSIS — E039 Hypothyroidism, unspecified: Secondary | ICD-10-CM | POA: Diagnosis not present

## 2017-08-31 DIAGNOSIS — R531 Weakness: Secondary | ICD-10-CM

## 2017-08-31 DIAGNOSIS — I10 Essential (primary) hypertension: Secondary | ICD-10-CM | POA: Diagnosis not present

## 2017-08-31 LAB — CBC WITH DIFFERENTIAL/PLATELET
BASOS ABS: 0 10*3/uL (ref 0.0–0.1)
BASOS PCT: 1 %
Eosinophils Absolute: 0.1 10*3/uL (ref 0.0–0.7)
Eosinophils Relative: 1 %
HEMATOCRIT: 38.6 % (ref 36.0–46.0)
Hemoglobin: 13.3 g/dL (ref 12.0–15.0)
LYMPHS PCT: 36 %
Lymphs Abs: 1.6 10*3/uL (ref 0.7–4.0)
MCH: 31.3 pg (ref 26.0–34.0)
MCHC: 34.5 g/dL (ref 30.0–36.0)
MCV: 90.8 fL (ref 78.0–100.0)
MONO ABS: 0.4 10*3/uL (ref 0.1–1.0)
Monocytes Relative: 9 %
NEUTROS ABS: 2.4 10*3/uL (ref 1.7–7.7)
NEUTROS PCT: 53 %
Platelets: 188 10*3/uL (ref 150–400)
RBC: 4.25 MIL/uL (ref 3.87–5.11)
RDW: 15.3 % (ref 11.5–15.5)
WBC: 4.5 10*3/uL (ref 4.0–10.5)

## 2017-08-31 LAB — BASIC METABOLIC PANEL
Anion gap: 10 (ref 5–15)
BUN: 16 mg/dL (ref 6–20)
CALCIUM: 8.7 mg/dL — AB (ref 8.9–10.3)
CO2: 24 mmol/L (ref 22–32)
Chloride: 105 mmol/L (ref 101–111)
Creatinine, Ser: 0.96 mg/dL (ref 0.44–1.00)
GFR, EST NON AFRICAN AMERICAN: 56 mL/min — AB (ref >60)
GLUCOSE: 102 mg/dL — AB (ref 65–99)
POTASSIUM: 3.6 mmol/L (ref 3.5–5.1)
SODIUM: 139 mmol/L (ref 135–145)

## 2017-08-31 LAB — TROPONIN I: Troponin I: <0.03 ng/mL (ref <0.03)

## 2017-08-31 NOTE — ED Notes (Signed)
EDP made aware of pt's neuro symptoms.

## 2017-08-31 NOTE — ED Triage Notes (Signed)
Pt c/o low BP and dizziness x 3 days, pt drove self, states she feels better today
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
## 2017-08-31 NOTE — ED Notes (Signed)
Pt reports on Sunday she felt generalized weakness and when she checked her B/P she noticed it was 80/55. Pt states the weakness started to resolve yesterday evening. Pt denies any symptoms today and states "I thought I would just get checked out." Pt recently started on a vegetarian diet without medical supervision last week as an attempt to lose weight.

## 2017-08-31 NOTE — Discharge Instructions (Addendum)
1.  Since you had low blood pressure a couple of days ago, decrease your dose of Coreg from 25 mg twice a day to 12.5 mg twice a day.  Continue to monitor your blood pressure at home.  Follow-up with your doctor as soon as possible for recheck.

## 2017-09-12 ENCOUNTER — Emergency Department (HOSPITAL_COMMUNITY)
Admission: EM | Admit: 2017-09-12 | Discharge: 2017-09-12 | Disposition: A | Discharge status: Home / Self Care | Payer: Medicare HMO | Attending: Emergency Medicine | Admitting: Emergency Medicine

## 2017-09-12 DIAGNOSIS — I10 Essential (primary) hypertension: Secondary | ICD-10-CM | POA: Insufficient documentation

## 2017-09-12 DIAGNOSIS — R42 Dizziness and giddiness: Secondary | ICD-10-CM | POA: Diagnosis not present

## 2017-09-12 DIAGNOSIS — Z79899 Other long term (current) drug therapy: Secondary | ICD-10-CM | POA: Insufficient documentation

## 2017-09-12 DIAGNOSIS — Z7901 Long term (current) use of anticoagulants: Secondary | ICD-10-CM | POA: Diagnosis not present

## 2017-09-12 DIAGNOSIS — Z7982 Long term (current) use of aspirin: Secondary | ICD-10-CM | POA: Diagnosis not present

## 2017-09-12 DIAGNOSIS — E039 Hypothyroidism, unspecified: Secondary | ICD-10-CM | POA: Insufficient documentation

## 2017-09-12 DIAGNOSIS — R531 Weakness: Secondary | ICD-10-CM | POA: Diagnosis present

## 2017-09-12 DIAGNOSIS — I952 Hypotension due to drugs: Secondary | ICD-10-CM | POA: Diagnosis not present

## 2017-09-12 LAB — CBC WITH DIFFERENTIAL/PLATELET
BASOS PCT: 0 %
Basophils Absolute: 0 10*3/uL (ref 0.0–0.1)
EOS ABS: 0.1 10*3/uL (ref 0.0–0.7)
Eosinophils Relative: 2 %
HCT: 36.9 % (ref 36.0–46.0)
HEMOGLOBIN: 12.2 g/dL (ref 12.0–15.0)
Lymphocytes Relative: 35 %
Lymphs Abs: 1.3 10*3/uL (ref 0.7–4.0)
MCH: 30.4 pg (ref 26.0–34.0)
MCHC: 33.1 g/dL (ref 30.0–36.0)
MCV: 92 fL (ref 78.0–100.0)
MONOS PCT: 8 %
Monocytes Absolute: 0.3 10*3/uL (ref 0.1–1.0)
NEUTROS PCT: 55 %
Neutro Abs: 1.9 10*3/uL (ref 1.7–7.7)
Platelets: 145 10*3/uL — ABNORMAL LOW (ref 150–400)
RBC: 4.01 MIL/uL (ref 3.87–5.11)
RDW: 15.5 % (ref 11.5–15.5)
WBC: 3.5 10*3/uL — ABNORMAL LOW (ref 4.0–10.5)

## 2017-09-12 LAB — BASIC METABOLIC PANEL
Anion gap: 10 (ref 5–15)
BUN: 12 mg/dL (ref 6–20)
CALCIUM: 8.6 mg/dL — AB (ref 8.9–10.3)
CO2: 25 mmol/L (ref 22–32)
CREATININE: 1.08 mg/dL — AB (ref 0.44–1.00)
Chloride: 104 mmol/L (ref 101–111)
GFR calc non Af Amer: 49 mL/min — ABNORMAL LOW (ref >60)
GFR, EST AFRICAN AMERICAN: 57 mL/min — AB (ref >60)
Glucose, Bld: 103 mg/dL — ABNORMAL HIGH (ref 65–99)
Potassium: 3.6 mmol/L (ref 3.5–5.1)
SODIUM: 139 mmol/L (ref 135–145)
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
## 2017-09-12 NOTE — Discharge Instructions (Addendum)
STOP taking your Amlodipine. CHECK your blood pressure at home once daily and record. See your PCP this week.
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
## 2017-09-12 NOTE — ED Notes (Signed)
Bed: GN56 Expected date:  Expected time:  Means of arrival:  Comments: 77 yo syncope

## 2018-03-21 ENCOUNTER — Encounter (HOSPITAL_BASED_OUTPATIENT_CLINIC_OR_DEPARTMENT_OTHER): Payer: Self-pay | Admitting: *Deleted

## 2018-03-21 ENCOUNTER — Other Ambulatory Visit: Payer: Self-pay

## 2018-03-21 ENCOUNTER — Emergency Department (HOSPITAL_BASED_OUTPATIENT_CLINIC_OR_DEPARTMENT_OTHER)
Admission: EM | Admit: 2018-03-21 | Discharge: 2018-03-21 | Disposition: A | Discharge status: Home / Self Care | Payer: Medicare HMO | Attending: Emergency Medicine | Admitting: Emergency Medicine

## 2018-03-21 DIAGNOSIS — Z79899 Other long term (current) drug therapy: Secondary | ICD-10-CM | POA: Diagnosis not present

## 2018-03-21 DIAGNOSIS — R791 Abnormal coagulation profile: Secondary | ICD-10-CM | POA: Diagnosis not present

## 2018-03-21 DIAGNOSIS — Z7982 Long term (current) use of aspirin: Secondary | ICD-10-CM | POA: Diagnosis not present

## 2018-03-21 DIAGNOSIS — I10 Essential (primary) hypertension: Secondary | ICD-10-CM | POA: Diagnosis not present

## 2018-03-21 DIAGNOSIS — R42 Dizziness and giddiness: Secondary | ICD-10-CM | POA: Diagnosis present

## 2018-03-21 DIAGNOSIS — R8271 Bacteriuria: Secondary | ICD-10-CM

## 2018-03-21 DIAGNOSIS — Z7901 Long term (current) use of anticoagulants: Secondary | ICD-10-CM | POA: Diagnosis not present

## 2018-03-21 DIAGNOSIS — F329 Major depressive disorder, single episode, unspecified: Secondary | ICD-10-CM | POA: Diagnosis not present

## 2018-03-21 DIAGNOSIS — Z8673 Personal history of transient ischemic attack (TIA), and cerebral infarction without residual deficits: Secondary | ICD-10-CM | POA: Insufficient documentation

## 2018-03-21 LAB — BASIC METABOLIC PANEL
Anion gap: 10 (ref 5–15)
BUN: 11 mg/dL (ref 8–23)
CALCIUM: 8.8 mg/dL — AB (ref 8.9–10.3)
CO2: 22 mmol/L (ref 22–32)
Chloride: 108 mmol/L (ref 98–111)
Creatinine, Ser: 1.03 mg/dL — ABNORMAL HIGH (ref 0.44–1.00)
GFR calc Af Amer: >60 mL/min (ref >60)
GFR, EST NON AFRICAN AMERICAN: 52 mL/min — AB (ref >60)
GLUCOSE: 93 mg/dL (ref 70–99)
Potassium: 4.4 mmol/L (ref 3.5–5.1)
Sodium: 140 mmol/L (ref 135–145)

## 2018-03-21 LAB — URINALYSIS, ROUTINE W REFLEX MICROSCOPIC
Bilirubin Urine: NEGATIVE
GLUCOSE, UA: NEGATIVE mg/dL
Hgb urine dipstick: NEGATIVE
Ketones, ur: NEGATIVE mg/dL
Nitrite: NEGATIVE
PROTEIN: NEGATIVE mg/dL
Specific Gravity, Urine: 1.01 (ref 1.005–1.030)
pH: 8.5 — ABNORMAL HIGH (ref 5.0–8.0)

## 2018-03-21 LAB — URINALYSIS, MICROSCOPIC (REFLEX)

## 2018-03-21 LAB — CBC
HCT: 40.7 % (ref 36.0–46.0)
Hemoglobin: 13.2 g/dL (ref 12.0–15.0)
MCH: 30.2 pg (ref 26.0–34.0)
MCHC: 32.4 g/dL (ref 30.0–36.0)
MCV: 93.1 fL (ref 80.0–100.0)
PLATELETS: 194 10*3/uL (ref 150–400)
RBC: 4.37 MIL/uL (ref 3.87–5.11)
RDW: 16 % — ABNORMAL HIGH (ref 11.5–15.5)
WBC: 2.8 10*3/uL — ABNORMAL LOW (ref 4.0–10.5)
nRBC: 0 % (ref 0.0–0.2)

## 2018-03-21 LAB — PROTIME-INR
INR: 2.28
Prothrombin Time: 24.8 seconds — ABNORMAL HIGH (ref 11.4–15.2)

## 2018-03-21 LAB — CBG MONITORING, ED: GLUCOSE-CAPILLARY: 84 mg/dL (ref 70–99)

## 2018-03-21 LAB — I-STAT CG4 LACTIC ACID, ED: LACTIC ACID, VENOUS: 1.1 mmol/L (ref 0.5–1.9)

## 2018-03-21 NOTE — ED Notes (Signed)
Pt on monitor 

## 2018-03-21 NOTE — ED Triage Notes (Signed)
Dizziness. States her blood has been "running thick" and she wants it rechecked. She is taking Coumadin. States her MD is not aware she is coming here today but she is taking matters into her own hands.
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
## 2018-03-21 NOTE — Discharge Instructions (Addendum)
Your INR value appears to be appropriate. Please ask your primary care doctor to see if you need to be established by a Coumadin clinic who can monitor your INR value.  As discussed, if you start having any urinary symptoms please call your primary care doctor for further care.

## 2018-11-19 ENCOUNTER — Emergency Department (HOSPITAL_BASED_OUTPATIENT_CLINIC_OR_DEPARTMENT_OTHER)
Admission: EM | Admit: 2018-11-19 | Discharge: 2018-11-19 | Disposition: A | Discharge status: Home / Self Care | Payer: Medicare HMO | Attending: Emergency Medicine | Admitting: Emergency Medicine

## 2018-11-19 ENCOUNTER — Emergency Department (HOSPITAL_BASED_OUTPATIENT_CLINIC_OR_DEPARTMENT_OTHER): Payer: Medicare HMO

## 2018-11-19 ENCOUNTER — Encounter (HOSPITAL_BASED_OUTPATIENT_CLINIC_OR_DEPARTMENT_OTHER): Payer: Self-pay | Admitting: Emergency Medicine

## 2018-11-19 ENCOUNTER — Other Ambulatory Visit: Payer: Self-pay

## 2018-11-19 DIAGNOSIS — Z79899 Other long term (current) drug therapy: Secondary | ICD-10-CM | POA: Insufficient documentation

## 2018-11-19 DIAGNOSIS — I10 Essential (primary) hypertension: Secondary | ICD-10-CM | POA: Diagnosis not present

## 2018-11-19 DIAGNOSIS — Z7901 Long term (current) use of anticoagulants: Secondary | ICD-10-CM | POA: Diagnosis not present

## 2018-11-19 DIAGNOSIS — H81399 Other peripheral vertigo, unspecified ear: Secondary | ICD-10-CM | POA: Diagnosis not present

## 2018-11-19 DIAGNOSIS — E039 Hypothyroidism, unspecified: Secondary | ICD-10-CM | POA: Diagnosis not present

## 2018-11-19 DIAGNOSIS — Z7982 Long term (current) use of aspirin: Secondary | ICD-10-CM | POA: Diagnosis not present

## 2018-11-19 DIAGNOSIS — R42 Dizziness and giddiness: Secondary | ICD-10-CM | POA: Diagnosis present

## 2018-11-19 LAB — COMPREHENSIVE METABOLIC PANEL
ALT: 15 U/L (ref 0–44)
AST: 20 U/L (ref 15–41)
Albumin: 3.8 g/dL (ref 3.5–5.0)
Alkaline Phosphatase: 63 U/L (ref 38–126)
Anion gap: 11 (ref 5–15)
BUN: 16 mg/dL (ref 8–23)
CO2: 20 mmol/L — ABNORMAL LOW (ref 22–32)
Calcium: 8.8 mg/dL — ABNORMAL LOW (ref 8.9–10.3)
Chloride: 109 mmol/L (ref 98–111)
Creatinine, Ser: 0.8 mg/dL (ref 0.44–1.00)
GFR calc Af Amer: >60 mL/min (ref >60)
GFR calc non Af Amer: >60 mL/min (ref >60)
Glucose, Bld: 100 mg/dL — ABNORMAL HIGH (ref 70–99)
Potassium: 3.7 mmol/L (ref 3.5–5.1)
Sodium: 140 mmol/L (ref 135–145)
Total Bilirubin: 0.8 mg/dL (ref 0.3–1.2)
Total Protein: 7.4 g/dL (ref 6.5–8.1)

## 2018-11-19 LAB — CBC
HCT: 41.6 % (ref 36.0–46.0)
Hemoglobin: 13.7 g/dL (ref 12.0–15.0)
MCH: 31 pg (ref 26.0–34.0)
MCHC: 32.9 g/dL (ref 30.0–36.0)
MCV: 94.1 fL (ref 80.0–100.0)
Platelets: 190 10*3/uL (ref 150–400)
RBC: 4.42 MIL/uL (ref 3.87–5.11)
RDW: 15.8 % — ABNORMAL HIGH (ref 11.5–15.5)
WBC: 4 10*3/uL (ref 4.0–10.5)
nRBC: 0 % (ref 0.0–0.2)

## 2018-11-19 MED ORDER — MECLIZINE HCL 25 MG PO TABS: 25.0000 mg | ORAL_TABLET | Freq: Three times a day (TID) | ORAL | 0 refills | Status: DC | PRN

## 2018-11-19 MED ORDER — MECLIZINE HCL 25 MG PO TABS
25.0000 mg | ORAL_TABLET | Freq: Once | ORAL | Status: AC
  Administered 2018-11-19: 25 mg via ORAL
  Filled 2018-11-19: qty 1

## 2018-11-19 NOTE — ED Triage Notes (Addendum)
Intermittent dizziness x 4 days. Denies pain. Pt states she fell last night because she was dizzy. Denies injury.

## 2018-11-19 NOTE — ED Notes (Signed)
Pt transported to CT at this time via stretcher

## 2018-11-19 NOTE — ED Notes (Signed)
Pt c/o mild dizziness when standing to use bedside commode.

## 2018-11-19 NOTE — ED Notes (Signed)
Pt ambulated in hall with no dizziness. Reports that her dizziness comes and goes.

## 2018-11-19 NOTE — ED Notes (Signed)
Pt returned from CT at this time.  
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
## 2018-11-22 ENCOUNTER — Emergency Department (HOSPITAL_BASED_OUTPATIENT_CLINIC_OR_DEPARTMENT_OTHER)
Admission: EM | Admit: 2018-11-22 | Discharge: 2018-11-22 | Disposition: A | Discharge status: Home / Self Care | Payer: Medicare HMO | Attending: Emergency Medicine | Admitting: Emergency Medicine

## 2018-11-22 ENCOUNTER — Encounter (HOSPITAL_BASED_OUTPATIENT_CLINIC_OR_DEPARTMENT_OTHER): Payer: Self-pay

## 2018-11-22 ENCOUNTER — Other Ambulatory Visit: Payer: Self-pay

## 2018-11-22 DIAGNOSIS — Y92013 Bedroom of single-family (private) house as the place of occurrence of the external cause: Secondary | ICD-10-CM | POA: Insufficient documentation

## 2018-11-22 DIAGNOSIS — Z7982 Long term (current) use of aspirin: Secondary | ICD-10-CM | POA: Insufficient documentation

## 2018-11-22 DIAGNOSIS — Y999 Unspecified external cause status: Secondary | ICD-10-CM | POA: Diagnosis not present

## 2018-11-22 DIAGNOSIS — I10 Essential (primary) hypertension: Secondary | ICD-10-CM | POA: Insufficient documentation

## 2018-11-22 DIAGNOSIS — Z8673 Personal history of transient ischemic attack (TIA), and cerebral infarction without residual deficits: Secondary | ICD-10-CM | POA: Insufficient documentation

## 2018-11-22 DIAGNOSIS — R42 Dizziness and giddiness: Secondary | ICD-10-CM | POA: Diagnosis not present

## 2018-11-22 DIAGNOSIS — W19XXXA Unspecified fall, initial encounter: Secondary | ICD-10-CM | POA: Diagnosis not present

## 2018-11-22 DIAGNOSIS — Y9389 Activity, other specified: Secondary | ICD-10-CM | POA: Diagnosis not present

## 2018-11-22 DIAGNOSIS — Z79899 Other long term (current) drug therapy: Secondary | ICD-10-CM | POA: Diagnosis not present

## 2018-11-22 DIAGNOSIS — Z7901 Long term (current) use of anticoagulants: Secondary | ICD-10-CM | POA: Diagnosis not present

## 2018-11-22 DIAGNOSIS — E039 Hypothyroidism, unspecified: Secondary | ICD-10-CM | POA: Insufficient documentation

## 2018-11-22 LAB — URINALYSIS, MICROSCOPIC (REFLEX)

## 2018-11-22 LAB — URINALYSIS, ROUTINE W REFLEX MICROSCOPIC
Bilirubin Urine: NEGATIVE
Glucose, UA: NEGATIVE mg/dL
Hgb urine dipstick: NEGATIVE
Ketones, ur: NEGATIVE mg/dL
Nitrite: NEGATIVE
Protein, ur: NEGATIVE mg/dL
Specific Gravity, Urine: 1.015 (ref 1.005–1.030)
pH: 7 (ref 5.0–8.0)

## 2018-11-22 LAB — CBC WITH DIFFERENTIAL/PLATELET
Abs Immature Granulocytes: 0.01 10*3/uL (ref 0.00–0.07)
Basophils Absolute: 0 10*3/uL (ref 0.0–0.1)
Basophils Relative: 1 %
Eosinophils Absolute: 0.1 10*3/uL (ref 0.0–0.5)
Eosinophils Relative: 3 %
HCT: 39 % (ref 36.0–46.0)
Hemoglobin: 12.9 g/dL (ref 12.0–15.0)
Immature Granulocytes: 0 %
Lymphocytes Relative: 36 %
Lymphs Abs: 1.4 10*3/uL (ref 0.7–4.0)
MCH: 31 pg (ref 26.0–34.0)
MCHC: 33.1 g/dL (ref 30.0–36.0)
MCV: 93.8 fL (ref 80.0–100.0)
Monocytes Absolute: 0.4 10*3/uL (ref 0.1–1.0)
Monocytes Relative: 11 %
Neutro Abs: 1.9 10*3/uL (ref 1.7–7.7)
Neutrophils Relative %: 49 %
Platelets: 167 10*3/uL (ref 150–400)
RBC: 4.16 MIL/uL (ref 3.87–5.11)
RDW: 15.6 % — ABNORMAL HIGH (ref 11.5–15.5)
WBC: 3.9 10*3/uL — ABNORMAL LOW (ref 4.0–10.5)
nRBC: 0 % (ref 0.0–0.2)

## 2018-11-22 LAB — BASIC METABOLIC PANEL
Anion gap: 8 (ref 5–15)
BUN: 16 mg/dL (ref 8–23)
CO2: 25 mmol/L (ref 22–32)
Calcium: 8.6 mg/dL — ABNORMAL LOW (ref 8.9–10.3)
Chloride: 106 mmol/L (ref 98–111)
Creatinine, Ser: 0.77 mg/dL (ref 0.44–1.00)
GFR calc Af Amer: >60 mL/min (ref >60)
GFR calc non Af Amer: >60 mL/min (ref >60)
Glucose, Bld: 96 mg/dL (ref 70–99)
Potassium: 3.6 mmol/L (ref 3.5–5.1)
Sodium: 139 mmol/L (ref 135–145)

## 2018-11-22 MED ORDER — SODIUM CHLORIDE 0.9 % IV BOLUS
1000.0000 mL | Freq: Once | INTRAVENOUS | Status: AC
  Administered 2018-11-22: 1000 mL via INTRAVENOUS

## 2018-11-22 NOTE — ED Triage Notes (Signed)
Pt states she became dizzy and fell on her bed ~30 min PTA-denies pain-states she was sen here 7/4 for c/o dizziness-NAD-to triage in w/c
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
## 2018-11-22 NOTE — ED Notes (Signed)
Pt on monitor 

## 2018-12-12 ENCOUNTER — Emergency Department (HOSPITAL_BASED_OUTPATIENT_CLINIC_OR_DEPARTMENT_OTHER): Payer: Medicare HMO

## 2018-12-12 ENCOUNTER — Other Ambulatory Visit: Payer: Self-pay

## 2018-12-12 ENCOUNTER — Emergency Department (HOSPITAL_BASED_OUTPATIENT_CLINIC_OR_DEPARTMENT_OTHER)
Admission: EM | Admit: 2018-12-12 | Discharge: 2018-12-12 | Disposition: A | Discharge status: Home / Self Care | Payer: Medicare HMO | Attending: Emergency Medicine | Admitting: Emergency Medicine

## 2018-12-12 ENCOUNTER — Encounter (HOSPITAL_BASED_OUTPATIENT_CLINIC_OR_DEPARTMENT_OTHER): Payer: Self-pay | Admitting: *Deleted

## 2018-12-12 DIAGNOSIS — I10 Essential (primary) hypertension: Secondary | ICD-10-CM | POA: Diagnosis not present

## 2018-12-12 DIAGNOSIS — Z79899 Other long term (current) drug therapy: Secondary | ICD-10-CM | POA: Diagnosis not present

## 2018-12-12 DIAGNOSIS — Z7982 Long term (current) use of aspirin: Secondary | ICD-10-CM | POA: Insufficient documentation

## 2018-12-12 DIAGNOSIS — Z7901 Long term (current) use of anticoagulants: Secondary | ICD-10-CM | POA: Diagnosis not present

## 2018-12-12 DIAGNOSIS — M7989 Other specified soft tissue disorders: Secondary | ICD-10-CM

## 2018-12-12 DIAGNOSIS — E039 Hypothyroidism, unspecified: Secondary | ICD-10-CM | POA: Insufficient documentation

## 2018-12-12 DIAGNOSIS — R079 Chest pain, unspecified: Secondary | ICD-10-CM

## 2018-12-12 DIAGNOSIS — R42 Dizziness and giddiness: Secondary | ICD-10-CM | POA: Diagnosis present

## 2018-12-12 DIAGNOSIS — R2243 Localized swelling, mass and lump, lower limb, bilateral: Secondary | ICD-10-CM | POA: Insufficient documentation

## 2018-12-12 LAB — CBC WITH DIFFERENTIAL/PLATELET
Abs Immature Granulocytes: 0.01 10*3/uL (ref 0.00–0.07)
Basophils Absolute: 0 10*3/uL (ref 0.0–0.1)
Basophils Relative: 1 %
Eosinophils Absolute: 0.1 10*3/uL (ref 0.0–0.5)
Eosinophils Relative: 3 %
HCT: 41 % (ref 36.0–46.0)
Hemoglobin: 13.4 g/dL (ref 12.0–15.0)
Immature Granulocytes: 0 %
Lymphocytes Relative: 36 %
Lymphs Abs: 1.2 10*3/uL (ref 0.7–4.0)
MCH: 30.7 pg (ref 26.0–34.0)
MCHC: 32.7 g/dL (ref 30.0–36.0)
MCV: 93.8 fL (ref 80.0–100.0)
Monocytes Absolute: 0.4 10*3/uL (ref 0.1–1.0)
Monocytes Relative: 14 %
Neutro Abs: 1.5 10*3/uL — ABNORMAL LOW (ref 1.7–7.7)
Neutrophils Relative %: 46 %
Platelets: 176 10*3/uL (ref 150–400)
RBC: 4.37 MIL/uL (ref 3.87–5.11)
RDW: 15.7 % — ABNORMAL HIGH (ref 11.5–15.5)
WBC: 3.2 10*3/uL — ABNORMAL LOW (ref 4.0–10.5)
nRBC: 0 % (ref 0.0–0.2)

## 2018-12-12 LAB — BASIC METABOLIC PANEL
Anion gap: 9 (ref 5–15)
BUN: 17 mg/dL (ref 8–23)
CO2: 26 mmol/L (ref 22–32)
Calcium: 8.8 mg/dL — ABNORMAL LOW (ref 8.9–10.3)
Chloride: 103 mmol/L (ref 98–111)
Creatinine, Ser: 0.94 mg/dL (ref 0.44–1.00)
GFR calc Af Amer: >60 mL/min (ref >60)
GFR calc non Af Amer: 59 mL/min — ABNORMAL LOW (ref >60)
Glucose, Bld: 103 mg/dL — ABNORMAL HIGH (ref 70–99)
Potassium: 3.8 mmol/L (ref 3.5–5.1)
Sodium: 138 mmol/L (ref 135–145)

## 2018-12-12 MED ORDER — MECLIZINE HCL 25 MG PO TABS
25.0000 mg | ORAL_TABLET | Freq: Once | ORAL | Status: AC
  Administered 2018-12-12: 13:00:00 25 mg via ORAL
  Filled 2018-12-12: qty 1

## 2018-12-12 MED ORDER — MECLIZINE HCL 25 MG PO TABS: 25.0000 mg | ORAL_TABLET | Freq: Three times a day (TID) | ORAL | 0 refills | Status: DC | PRN

## 2018-12-12 MED ORDER — FUROSEMIDE 10 MG/ML IJ SOLN
40.0000 mg | Freq: Once | INTRAMUSCULAR | Status: AC
  Administered 2018-12-12: 40 mg via INTRAVENOUS
  Filled 2018-12-12: qty 4

## 2018-12-12 NOTE — ED Notes (Signed)
ED Provider at bedside. 

## 2018-12-12 NOTE — ED Triage Notes (Signed)
Dizziness when she looks down per pt. She noticed it yesterday. Swelling in her arms and legs for a while but she is not aware of how long.
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
## 2018-12-12 NOTE — ED Notes (Signed)
Assisted with bed pan.

## 2018-12-12 NOTE — Discharge Instructions (Addendum)
Take 40 mg of Lasix daily for the next 4 days.  Follow-up with ear nose and throat doctor as discussed and as scheduled.

## 2018-12-12 NOTE — ED Notes (Signed)
Pt assisted with bedpan.,

## 2019-01-29 ENCOUNTER — Emergency Department (HOSPITAL_BASED_OUTPATIENT_CLINIC_OR_DEPARTMENT_OTHER)
Admission: EM | Admit: 2019-01-29 | Discharge: 2019-01-29 | Disposition: A | Discharge status: Home / Self Care | Payer: Medicare HMO | Attending: Emergency Medicine | Admitting: Emergency Medicine

## 2019-01-29 ENCOUNTER — Other Ambulatory Visit: Payer: Self-pay

## 2019-01-29 ENCOUNTER — Encounter (HOSPITAL_BASED_OUTPATIENT_CLINIC_OR_DEPARTMENT_OTHER): Payer: Self-pay | Admitting: *Deleted

## 2019-01-29 DIAGNOSIS — I10 Essential (primary) hypertension: Secondary | ICD-10-CM | POA: Diagnosis present

## 2019-01-29 DIAGNOSIS — G479 Sleep disorder, unspecified: Secondary | ICD-10-CM | POA: Insufficient documentation
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
## 2019-01-29 NOTE — Discharge Instructions (Signed)
You were seen in the emergency department today with difficulty sleeping.  Please discuss with the pharmacy regarding melatonin.  Perhaps they can find a formulation that does not include shellfish.  Please call your primary care doctor for any stronger sleep aid medications.

## 2019-01-29 NOTE — ED Notes (Signed)
ED Provider at bedside. 

## 2019-01-29 NOTE — ED Triage Notes (Signed)
Pt reports her BP has been high today and she needs something to help her sleep. C/o slight headache. States she took " medicine that doctor in [place] gave me but it made me dizzy"

## 2019-05-22 ENCOUNTER — Ambulatory Visit: Payer: Medicare HMO | Attending: Internal Medicine

## 2019-05-22 DIAGNOSIS — Z20822 Contact with and (suspected) exposure to covid-19: Secondary | ICD-10-CM

## 2019-05-23 LAB — NOVEL CORONAVIRUS, NAA: SARS-CoV-2, NAA: DETECTED — AB

## 2019-06-15 ENCOUNTER — Encounter (HOSPITAL_BASED_OUTPATIENT_CLINIC_OR_DEPARTMENT_OTHER): Payer: Self-pay | Admitting: *Deleted

## 2019-06-15 ENCOUNTER — Emergency Department (HOSPITAL_BASED_OUTPATIENT_CLINIC_OR_DEPARTMENT_OTHER): Payer: Medicare HMO

## 2019-06-15 ENCOUNTER — Emergency Department (HOSPITAL_BASED_OUTPATIENT_CLINIC_OR_DEPARTMENT_OTHER)
Admission: EM | Admit: 2019-06-15 | Discharge: 2019-06-15 | Disposition: A | Discharge status: Home / Self Care | Payer: Medicare HMO | Attending: Emergency Medicine | Admitting: Emergency Medicine

## 2019-06-15 ENCOUNTER — Other Ambulatory Visit: Payer: Self-pay

## 2019-06-15 DIAGNOSIS — Z8673 Personal history of transient ischemic attack (TIA), and cerebral infarction without residual deficits: Secondary | ICD-10-CM | POA: Insufficient documentation

## 2019-06-15 DIAGNOSIS — Z7982 Long term (current) use of aspirin: Secondary | ICD-10-CM | POA: Diagnosis not present

## 2019-06-15 DIAGNOSIS — R0789 Other chest pain: Secondary | ICD-10-CM | POA: Insufficient documentation

## 2019-06-15 DIAGNOSIS — U071 COVID-19: Secondary | ICD-10-CM | POA: Insufficient documentation

## 2019-06-15 DIAGNOSIS — Z79899 Other long term (current) drug therapy: Secondary | ICD-10-CM | POA: Diagnosis not present

## 2019-06-15 DIAGNOSIS — I10 Essential (primary) hypertension: Secondary | ICD-10-CM | POA: Diagnosis not present

## 2019-06-15 DIAGNOSIS — Z7901 Long term (current) use of anticoagulants: Secondary | ICD-10-CM | POA: Insufficient documentation

## 2019-06-15 DIAGNOSIS — R079 Chest pain, unspecified: Secondary | ICD-10-CM

## 2019-06-15 DIAGNOSIS — E039 Hypothyroidism, unspecified: Secondary | ICD-10-CM | POA: Diagnosis not present

## 2019-06-15 LAB — COMPREHENSIVE METABOLIC PANEL
ALT: 22 U/L (ref 0–44)
AST: 27 U/L (ref 15–41)
Albumin: 3.7 g/dL (ref 3.5–5.0)
Alkaline Phosphatase: 71 U/L (ref 38–126)
Anion gap: 8 (ref 5–15)
BUN: 11 mg/dL (ref 8–23)
CO2: 25 mmol/L (ref 22–32)
Calcium: 8.9 mg/dL (ref 8.9–10.3)
Chloride: 107 mmol/L (ref 98–111)
Creatinine, Ser: 0.87 mg/dL (ref 0.44–1.00)
GFR calc Af Amer: >60 mL/min (ref >60)
GFR calc non Af Amer: >60 mL/min (ref >60)
Glucose, Bld: 117 mg/dL — ABNORMAL HIGH (ref 70–99)
Potassium: 3.8 mmol/L (ref 3.5–5.1)
Sodium: 140 mmol/L (ref 135–145)
Total Bilirubin: 0.6 mg/dL (ref 0.3–1.2)
Total Protein: 7.4 g/dL (ref 6.5–8.1)

## 2019-06-15 LAB — CBC WITH DIFFERENTIAL/PLATELET
Abs Immature Granulocytes: 0 10*3/uL (ref 0.00–0.07)
Basophils Absolute: 0 10*3/uL (ref 0.0–0.1)
Basophils Relative: 1 %
Eosinophils Absolute: 0.1 10*3/uL (ref 0.0–0.5)
Eosinophils Relative: 3 %
HCT: 43.2 % (ref 36.0–46.0)
Hemoglobin: 14.2 g/dL (ref 12.0–15.0)
Immature Granulocytes: 0 %
Lymphocytes Relative: 44 %
Lymphs Abs: 1.4 10*3/uL (ref 0.7–4.0)
MCH: 30.3 pg (ref 26.0–34.0)
MCHC: 32.9 g/dL (ref 30.0–36.0)
MCV: 92.3 fL (ref 80.0–100.0)
Monocytes Absolute: 0.3 10*3/uL (ref 0.1–1.0)
Monocytes Relative: 8 %
Neutro Abs: 1.4 10*3/uL — ABNORMAL LOW (ref 1.7–7.7)
Neutrophils Relative %: 44 %
Platelets: 201 10*3/uL (ref 150–400)
RBC: 4.68 MIL/uL (ref 3.87–5.11)
RDW: 15.4 % (ref 11.5–15.5)
WBC: 3.3 10*3/uL — ABNORMAL LOW (ref 4.0–10.5)
nRBC: 0 % (ref 0.0–0.2)

## 2019-06-15 LAB — TROPONIN I (HIGH SENSITIVITY): Troponin I (High Sensitivity): <2 ng/L (ref <18)

## 2019-06-15 MED ORDER — TRAMADOL HCL 50 MG PO TABS: 50.0000 mg | ORAL_TABLET | Freq: Four times a day (QID) | ORAL | 0 refills | Status: DC | PRN

## 2019-06-15 MED ORDER — SODIUM CHLORIDE 0.9 % IV BOLUS
500.0000 mL | Freq: Once | INTRAVENOUS | Status: AC
  Administered 2019-06-15: 500 mL via INTRAVENOUS

## 2019-06-15 MED ORDER — IOHEXOL 350 MG/ML SOLN
100.0000 mL | Freq: Once | INTRAVENOUS | Status: AC
  Administered 2019-06-15: 23:00:00 100 mL via INTRAVENOUS
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
## 2019-06-15 NOTE — Discharge Instructions (Addendum)
Begin taking tramadol as prescribed as needed for pain.  Follow-up with your doctor if not improving in the next few days, and return to the ER if you develop worsening pain, difficulty breathing, or other new and concerning symptoms.

## 2019-06-15 NOTE — ED Triage Notes (Signed)
Covid  X 10 days , today c/o chest pain and upper back pain , SOB with walking , amb from triage to rm 11 02sat 98

## 2019-09-30 ENCOUNTER — Encounter (HOSPITAL_BASED_OUTPATIENT_CLINIC_OR_DEPARTMENT_OTHER): Payer: Self-pay | Admitting: Emergency Medicine

## 2019-09-30 ENCOUNTER — Emergency Department (HOSPITAL_BASED_OUTPATIENT_CLINIC_OR_DEPARTMENT_OTHER)
Admission: EM | Admit: 2019-09-30 | Discharge: 2019-09-30 | Disposition: A | Discharge status: Home / Self Care | Payer: Medicare HMO | Attending: Emergency Medicine | Admitting: Emergency Medicine

## 2019-09-30 ENCOUNTER — Emergency Department (HOSPITAL_BASED_OUTPATIENT_CLINIC_OR_DEPARTMENT_OTHER): Payer: Medicare HMO

## 2019-09-30 ENCOUNTER — Other Ambulatory Visit: Payer: Self-pay

## 2019-09-30 DIAGNOSIS — Z8673 Personal history of transient ischemic attack (TIA), and cerebral infarction without residual deficits: Secondary | ICD-10-CM | POA: Insufficient documentation

## 2019-09-30 DIAGNOSIS — Z79899 Other long term (current) drug therapy: Secondary | ICD-10-CM | POA: Diagnosis not present

## 2019-09-30 DIAGNOSIS — R0789 Other chest pain: Secondary | ICD-10-CM | POA: Insufficient documentation

## 2019-09-30 DIAGNOSIS — Z7982 Long term (current) use of aspirin: Secondary | ICD-10-CM | POA: Insufficient documentation

## 2019-09-30 DIAGNOSIS — E039 Hypothyroidism, unspecified: Secondary | ICD-10-CM | POA: Insufficient documentation

## 2019-09-30 DIAGNOSIS — I10 Essential (primary) hypertension: Secondary | ICD-10-CM | POA: Diagnosis not present

## 2019-09-30 LAB — COMPREHENSIVE METABOLIC PANEL
ALT: 18 U/L (ref 0–44)
AST: 23 U/L (ref 15–41)
Albumin: 3.6 g/dL (ref 3.5–5.0)
Alkaline Phosphatase: 63 U/L (ref 38–126)
Anion gap: 10 (ref 5–15)
BUN: 18 mg/dL (ref 8–23)
CO2: 24 mmol/L (ref 22–32)
Calcium: 8.7 mg/dL — ABNORMAL LOW (ref 8.9–10.3)
Chloride: 104 mmol/L (ref 98–111)
Creatinine, Ser: 0.99 mg/dL (ref 0.44–1.00)
GFR calc Af Amer: >60 mL/min (ref >60)
GFR calc non Af Amer: 55 mL/min — ABNORMAL LOW (ref >60)
Glucose, Bld: 94 mg/dL (ref 70–99)
Potassium: 3.5 mmol/L (ref 3.5–5.1)
Sodium: 138 mmol/L (ref 135–145)
Total Bilirubin: 0.4 mg/dL (ref 0.3–1.2)
Total Protein: 6.9 g/dL (ref 6.5–8.1)

## 2019-09-30 LAB — CBC WITH DIFFERENTIAL/PLATELET
Abs Immature Granulocytes: 0 10*3/uL (ref 0.00–0.07)
Basophils Absolute: 0 10*3/uL (ref 0.0–0.1)
Basophils Relative: 1 %
Eosinophils Absolute: 0.1 10*3/uL (ref 0.0–0.5)
Eosinophils Relative: 2 %
HCT: 39.7 % (ref 36.0–46.0)
Hemoglobin: 13.6 g/dL (ref 12.0–15.0)
Immature Granulocytes: 0 %
Lymphocytes Relative: 46 %
Lymphs Abs: 1.9 10*3/uL (ref 0.7–4.0)
MCH: 31.1 pg (ref 26.0–34.0)
MCHC: 34.3 g/dL (ref 30.0–36.0)
MCV: 90.8 fL (ref 80.0–100.0)
Monocytes Absolute: 0.4 10*3/uL (ref 0.1–1.0)
Monocytes Relative: 9 %
Neutro Abs: 1.7 10*3/uL (ref 1.7–7.7)
Neutrophils Relative %: 42 %
Platelets: 204 10*3/uL (ref 150–400)
RBC: 4.37 MIL/uL (ref 3.87–5.11)
RDW: 15.1 % (ref 11.5–15.5)
WBC: 4 10*3/uL (ref 4.0–10.5)
nRBC: 0 % (ref 0.0–0.2)

## 2019-09-30 LAB — TROPONIN I (HIGH SENSITIVITY)
Troponin I (High Sensitivity): 2 ng/L (ref <18)
Troponin I (High Sensitivity): <2 ng/L (ref <18)

## 2019-09-30 MED ORDER — LIDOCAINE VISCOUS HCL 2 % MT SOLN
15.0000 mL | Freq: Once | OROMUCOSAL | Status: AC
  Administered 2019-09-30: 15 mL via ORAL
  Filled 2019-09-30: qty 15

## 2019-09-30 MED ORDER — ALUM & MAG HYDROXIDE-SIMETH 200-200-20 MG/5ML PO SUSP
30.0000 mL | Freq: Once | ORAL | Status: AC
  Administered 2019-09-30: 30 mL via ORAL
  Filled 2019-09-30: qty 30

## 2019-09-30 MED ORDER — SUCRALFATE 1 GM/10ML PO SUSP
1.0000 g | Freq: Three times a day (TID) | ORAL | 0 refills | Status: DC
Start: 2019-09-30 — End: 2023-08-02

## 2019-09-30 NOTE — ED Triage Notes (Signed)
Patient states that she has had chest pain intermittently since last evening. Denies any SOB or N/V - Denies any at this time but reports that she had some while waiting in the waiting room

## 2019-09-30 NOTE — ED Notes (Signed)
Pt ambulated to restroom with stand by assistance
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
## 2019-09-30 NOTE — ED Notes (Signed)
Taken to xray at this time. 

## 2019-09-30 NOTE — Discharge Instructions (Signed)
As we discussed, your work-up today was reassuring.  I provided you with Carafate to help with reflux.  Follow-up with your primary care doctor.  Return to the Emergency Department immediately if you experiencing worsening chest pain, difficulty breathing, nausea/vomiting, get very sweaty, headache or any other worsening or concerning symptoms.

## 2019-11-04 ENCOUNTER — Other Ambulatory Visit: Payer: Self-pay

## 2019-11-04 ENCOUNTER — Encounter (HOSPITAL_BASED_OUTPATIENT_CLINIC_OR_DEPARTMENT_OTHER): Payer: Self-pay

## 2019-11-04 ENCOUNTER — Emergency Department (HOSPITAL_BASED_OUTPATIENT_CLINIC_OR_DEPARTMENT_OTHER): Payer: Medicare HMO

## 2019-11-04 ENCOUNTER — Emergency Department (HOSPITAL_BASED_OUTPATIENT_CLINIC_OR_DEPARTMENT_OTHER)
Admission: EM | Admit: 2019-11-04 | Discharge: 2019-11-04 | Disposition: A | Discharge status: Home / Self Care | Payer: Medicare HMO | Attending: Emergency Medicine | Admitting: Emergency Medicine

## 2019-11-04 DIAGNOSIS — R519 Headache, unspecified: Secondary | ICD-10-CM | POA: Insufficient documentation

## 2019-11-04 DIAGNOSIS — Z888 Allergy status to other drugs, medicaments and biological substances status: Secondary | ICD-10-CM | POA: Insufficient documentation

## 2019-11-04 DIAGNOSIS — I10 Essential (primary) hypertension: Secondary | ICD-10-CM | POA: Diagnosis not present

## 2019-11-04 DIAGNOSIS — Z760 Encounter for issue of repeat prescription: Secondary | ICD-10-CM | POA: Diagnosis not present

## 2019-11-04 DIAGNOSIS — Z79899 Other long term (current) drug therapy: Secondary | ICD-10-CM | POA: Insufficient documentation

## 2019-11-04 DIAGNOSIS — Z7982 Long term (current) use of aspirin: Secondary | ICD-10-CM | POA: Diagnosis not present

## 2019-11-04 DIAGNOSIS — E039 Hypothyroidism, unspecified: Secondary | ICD-10-CM | POA: Insufficient documentation

## 2019-11-04 DIAGNOSIS — Z8673 Personal history of transient ischemic attack (TIA), and cerebral infarction without residual deficits: Secondary | ICD-10-CM | POA: Diagnosis not present

## 2019-11-04 DIAGNOSIS — Z79890 Hormone replacement therapy (postmenopausal): Secondary | ICD-10-CM | POA: Insufficient documentation

## 2019-11-04 LAB — CBG MONITORING, ED: Glucose-Capillary: 86 mg/dL (ref 70–99)

## 2019-11-04 MED ORDER — LORAZEPAM 0.5 MG PO TABS
0.5000 mg | ORAL_TABLET | Freq: Every day | ORAL | 0 refills | Status: DC
Start: 2019-11-04 — End: 2023-08-02

## 2019-11-04 NOTE — Discharge Instructions (Signed)
Prescription given for Ativan. Take medication as directed and do not operate machinery, drive a car, or work while taking this medication as it can make you drowsy.  ° °Please follow up with your primary care provider within 5-7 days for re-evaluation of your symptoms. If you do not have a primary care provider, information for a healthcare clinic has been provided for you to make arrangements for follow up care. Please return to the emergency department for any new or worsening symptoms. ° °

## 2019-11-04 NOTE — ED Notes (Signed)
Patient transported to CT 
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
## 2019-11-04 NOTE — ED Triage Notes (Signed)
Pt c/o elevated B/P and HA x 4-5 days. Denies dizziness, CP, or ShOB.

## 2020-05-01 ENCOUNTER — Emergency Department (HOSPITAL_BASED_OUTPATIENT_CLINIC_OR_DEPARTMENT_OTHER)
Admission: EM | Admit: 2020-05-01 | Discharge: 2020-05-01 | Disposition: A | Discharge status: Home / Self Care | Payer: Medicare HMO | Attending: Emergency Medicine | Admitting: Emergency Medicine

## 2020-05-01 ENCOUNTER — Other Ambulatory Visit: Payer: Self-pay

## 2020-05-01 ENCOUNTER — Encounter (HOSPITAL_BASED_OUTPATIENT_CLINIC_OR_DEPARTMENT_OTHER): Payer: Self-pay

## 2020-05-01 ENCOUNTER — Other Ambulatory Visit (HOSPITAL_BASED_OUTPATIENT_CLINIC_OR_DEPARTMENT_OTHER): Payer: Self-pay | Admitting: Emergency Medicine

## 2020-05-01 DIAGNOSIS — M545 Low back pain, unspecified: Secondary | ICD-10-CM | POA: Diagnosis present

## 2020-05-01 DIAGNOSIS — Z79899 Other long term (current) drug therapy: Secondary | ICD-10-CM | POA: Diagnosis not present

## 2020-05-01 DIAGNOSIS — I10 Essential (primary) hypertension: Secondary | ICD-10-CM | POA: Insufficient documentation

## 2020-05-01 DIAGNOSIS — E039 Hypothyroidism, unspecified: Secondary | ICD-10-CM | POA: Diagnosis not present

## 2020-05-01 LAB — CBG MONITORING, ED: Glucose-Capillary: 126 mg/dL — ABNORMAL HIGH (ref 70–99)

## 2020-05-01 MED ORDER — METHYLPREDNISOLONE 4 MG PO TBPK
ORAL_TABLET | ORAL | 0 refills | Status: DC
Start: 2020-05-01 — End: 2023-08-02

## 2020-05-01 MED ORDER — CYCLOBENZAPRINE HCL 5 MG PO TABS
5.0000 mg | ORAL_TABLET | Freq: Two times a day (BID) | ORAL | 0 refills | Status: DC | PRN
Start: 2020-05-01 — End: 2023-08-02

## 2020-05-01 MED FILL — METHYLPREDNISOLONE 4 MG TBP: 4 | 6 days supply | Qty: 21 | Fill #0

## 2020-05-01 MED FILL — CYCLOBENZAPRINE HCL 5 MG TA: 5 | 5 days supply | Qty: 10 | Fill #0

## 2020-05-01 NOTE — ED Notes (Signed)
Patient requesting to get blood glucose level checked

## 2020-05-01 NOTE — Discharge Instructions (Signed)
Please follow-up with your primary care doctor to provide stool sample to evaluate for parasites.

## 2020-05-01 NOTE — ED Triage Notes (Addendum)
Pt c/o lower back pain x 3 weeks-denies injury-also c/o "parasites" x 1 month-NAD-slow gait

## 2020-05-01 NOTE — ED Notes (Signed)
She c/o bilat. Low back pain x 1 week. She tells me she had "sciatica" a few years ago and it feels like that" (bilaterally). She also tells me that, this week, upon having wiped after a b.m. she saw "little white string-looking things", [sic] which she theorizes may be a "parasite".
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
## 2020-08-04 ENCOUNTER — Other Ambulatory Visit: Payer: Self-pay

## 2020-08-04 ENCOUNTER — Emergency Department (HOSPITAL_BASED_OUTPATIENT_CLINIC_OR_DEPARTMENT_OTHER)
Admission: EM | Admit: 2020-08-04 | Discharge: 2020-08-04 | Disposition: A | Discharge status: Home / Self Care | Payer: Medicare HMO | Attending: Emergency Medicine | Admitting: Emergency Medicine

## 2020-08-04 ENCOUNTER — Emergency Department (HOSPITAL_BASED_OUTPATIENT_CLINIC_OR_DEPARTMENT_OTHER): Payer: Medicare HMO

## 2020-08-04 ENCOUNTER — Encounter (HOSPITAL_BASED_OUTPATIENT_CLINIC_OR_DEPARTMENT_OTHER): Payer: Self-pay

## 2020-08-04 DIAGNOSIS — R630 Anorexia: Secondary | ICD-10-CM | POA: Diagnosis not present

## 2020-08-04 DIAGNOSIS — E039 Hypothyroidism, unspecified: Secondary | ICD-10-CM | POA: Diagnosis not present

## 2020-08-04 DIAGNOSIS — I10 Essential (primary) hypertension: Secondary | ICD-10-CM | POA: Diagnosis not present

## 2020-08-04 DIAGNOSIS — R531 Weakness: Secondary | ICD-10-CM | POA: Diagnosis not present

## 2020-08-04 DIAGNOSIS — R112 Nausea with vomiting, unspecified: Secondary | ICD-10-CM | POA: Diagnosis not present

## 2020-08-04 DIAGNOSIS — Z7982 Long term (current) use of aspirin: Secondary | ICD-10-CM | POA: Insufficient documentation

## 2020-08-04 DIAGNOSIS — Z20822 Contact with and (suspected) exposure to covid-19: Secondary | ICD-10-CM | POA: Insufficient documentation

## 2020-08-04 DIAGNOSIS — R350 Frequency of micturition: Secondary | ICD-10-CM | POA: Diagnosis not present

## 2020-08-04 DIAGNOSIS — Z7901 Long term (current) use of anticoagulants: Secondary | ICD-10-CM | POA: Insufficient documentation

## 2020-08-04 DIAGNOSIS — Z79899 Other long term (current) drug therapy: Secondary | ICD-10-CM | POA: Diagnosis not present

## 2020-08-04 DIAGNOSIS — R519 Headache, unspecified: Secondary | ICD-10-CM | POA: Insufficient documentation

## 2020-08-04 DIAGNOSIS — R110 Nausea: Secondary | ICD-10-CM

## 2020-08-04 DIAGNOSIS — Z8673 Personal history of transient ischemic attack (TIA), and cerebral infarction without residual deficits: Secondary | ICD-10-CM | POA: Insufficient documentation

## 2020-08-04 LAB — URINALYSIS, ROUTINE W REFLEX MICROSCOPIC
Bilirubin Urine: NEGATIVE
Glucose, UA: NEGATIVE mg/dL
Hgb urine dipstick: NEGATIVE
Ketones, ur: NEGATIVE mg/dL
Nitrite: NEGATIVE
Protein, ur: NEGATIVE mg/dL
Specific Gravity, Urine: 1.015 (ref 1.005–1.030)
pH: 7 (ref 5.0–8.0)

## 2020-08-04 LAB — TROPONIN I (HIGH SENSITIVITY): Troponin I (High Sensitivity): <2 ng/L (ref <18)

## 2020-08-04 LAB — CBC WITH DIFFERENTIAL/PLATELET
Abs Immature Granulocytes: 0.01 10*3/uL (ref 0.00–0.07)
Basophils Absolute: 0 10*3/uL (ref 0.0–0.1)
Basophils Relative: 1 %
Eosinophils Absolute: 0.1 10*3/uL (ref 0.0–0.5)
Eosinophils Relative: 3 %
HCT: 41.6 % (ref 36.0–46.0)
Hemoglobin: 14 g/dL (ref 12.0–15.0)
Immature Granulocytes: 0 %
Lymphocytes Relative: 36 %
Lymphs Abs: 1.1 10*3/uL (ref 0.7–4.0)
MCH: 30.7 pg (ref 26.0–34.0)
MCHC: 33.7 g/dL (ref 30.0–36.0)
MCV: 91.2 fL (ref 80.0–100.0)
Monocytes Absolute: 0.3 10*3/uL (ref 0.1–1.0)
Monocytes Relative: 9 %
Neutro Abs: 1.6 10*3/uL — ABNORMAL LOW (ref 1.7–7.7)
Neutrophils Relative %: 51 %
Platelets: 174 10*3/uL (ref 150–400)
RBC: 4.56 MIL/uL (ref 3.87–5.11)
RDW: 15.1 % (ref 11.5–15.5)
WBC: 3.1 10*3/uL — ABNORMAL LOW (ref 4.0–10.5)
nRBC: 0 % (ref 0.0–0.2)

## 2020-08-04 LAB — COMPREHENSIVE METABOLIC PANEL
ALT: 14 U/L (ref 0–44)
AST: 17 U/L (ref 15–41)
Albumin: 3.6 g/dL (ref 3.5–5.0)
Alkaline Phosphatase: 62 U/L (ref 38–126)
Anion gap: 8 (ref 5–15)
BUN: 13 mg/dL (ref 8–23)
CO2: 24 mmol/L (ref 22–32)
Calcium: 8.8 mg/dL — ABNORMAL LOW (ref 8.9–10.3)
Chloride: 107 mmol/L (ref 98–111)
Creatinine, Ser: 0.9 mg/dL (ref 0.44–1.00)
GFR, Estimated: >60 mL/min (ref >60)
Glucose, Bld: 99 mg/dL (ref 70–99)
Potassium: 4 mmol/L (ref 3.5–5.1)
Sodium: 139 mmol/L (ref 135–145)
Total Bilirubin: 0.6 mg/dL (ref 0.3–1.2)
Total Protein: 7 g/dL (ref 6.5–8.1)

## 2020-08-04 LAB — LIPASE, BLOOD: Lipase: 29 U/L (ref 11–51)

## 2020-08-04 LAB — URINALYSIS, MICROSCOPIC (REFLEX)

## 2020-08-04 LAB — SARS CORONAVIRUS 2 (TAT 6-24 HRS): SARS Coronavirus 2: NEGATIVE

## 2020-08-04 LAB — CBG MONITORING, ED: Glucose-Capillary: 90 mg/dL (ref 70–99)

## 2020-08-04 LAB — TSH: TSH: 0.034 u[IU]/mL — ABNORMAL LOW (ref 0.350–4.500)

## 2020-08-04 MED ORDER — ONDANSETRON HCL 4 MG/2ML IJ SOLN
4.0000 mg | Freq: Once | INTRAMUSCULAR | Status: AC
  Administered 2020-08-04: 4 mg via INTRAVENOUS
  Filled 2020-08-04: qty 2

## 2020-08-04 MED ORDER — ALUM & MAG HYDROXIDE-SIMETH 200-200-20 MG/5ML PO SUSP
30.0000 mL | Freq: Once | ORAL | Status: AC
  Administered 2020-08-04: 30 mL via ORAL
  Filled 2020-08-04: qty 30

## 2020-08-04 MED ORDER — ONDANSETRON 4 MG PO TBDP: 4.0000 mg | ORAL_TABLET | Freq: Once | ORAL | Status: DC

## 2020-08-04 MED ORDER — ONDANSETRON 4 MG PO TBDP: 4.0000 mg | ORAL_TABLET | Freq: Three times a day (TID) | ORAL | 0 refills | Status: AC | PRN

## 2020-08-04 MED ORDER — SODIUM CHLORIDE 0.9 % IV BOLUS
500.0000 mL | Freq: Once | INTRAVENOUS | Status: AC
  Administered 2020-08-04: 500 mL via INTRAVENOUS

## 2020-08-04 MED ORDER — LIDOCAINE VISCOUS HCL 2 % MT SOLN
15.0000 mL | Freq: Once | OROMUCOSAL | Status: AC
  Administered 2020-08-04: 15 mL via ORAL
  Filled 2020-08-04: qty 15

## 2020-08-04 NOTE — Discharge Instructions (Addendum)
Discussed your lab results at length.  You will need to follow-up with your primary care physician in the upcoming week.  I have provided a prescription for nausea medication, please take this as needed.

## 2020-08-04 NOTE — ED Notes (Signed)
Pt transported to CT ?

## 2020-08-04 NOTE — ED Triage Notes (Addendum)
Pt c/o nausea x 1 week. States vomited yesterday. Denies diarrhea. States has been having "reflux" that made her feel like she was having a heart attack. Tolerating PO, decreased appetite. States has had urinary frequency, is pre-diabetic.
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
## 2020-08-06 LAB — URINE CULTURE

## 2020-09-13 ENCOUNTER — Encounter (HOSPITAL_BASED_OUTPATIENT_CLINIC_OR_DEPARTMENT_OTHER): Payer: Self-pay | Admitting: *Deleted

## 2020-09-13 ENCOUNTER — Other Ambulatory Visit: Payer: Self-pay

## 2020-09-13 ENCOUNTER — Emergency Department (HOSPITAL_BASED_OUTPATIENT_CLINIC_OR_DEPARTMENT_OTHER)
Admission: EM | Admit: 2020-09-13 | Discharge: 2020-09-13 | Disposition: A | Discharge status: Home / Self Care | Payer: Medicare HMO | Attending: Emergency Medicine | Admitting: Emergency Medicine

## 2020-09-13 ENCOUNTER — Emergency Department (HOSPITAL_BASED_OUTPATIENT_CLINIC_OR_DEPARTMENT_OTHER): Payer: Medicare HMO

## 2020-09-13 DIAGNOSIS — I10 Essential (primary) hypertension: Secondary | ICD-10-CM | POA: Diagnosis not present

## 2020-09-13 DIAGNOSIS — Z7982 Long term (current) use of aspirin: Secondary | ICD-10-CM | POA: Diagnosis not present

## 2020-09-13 DIAGNOSIS — Z7901 Long term (current) use of anticoagulants: Secondary | ICD-10-CM | POA: Insufficient documentation

## 2020-09-13 DIAGNOSIS — Z8673 Personal history of transient ischemic attack (TIA), and cerebral infarction without residual deficits: Secondary | ICD-10-CM | POA: Diagnosis not present

## 2020-09-13 DIAGNOSIS — E039 Hypothyroidism, unspecified: Secondary | ICD-10-CM | POA: Insufficient documentation

## 2020-09-13 DIAGNOSIS — K5900 Constipation, unspecified: Secondary | ICD-10-CM | POA: Insufficient documentation

## 2020-09-13 DIAGNOSIS — Z79899 Other long term (current) drug therapy: Secondary | ICD-10-CM | POA: Insufficient documentation

## 2020-09-13 DIAGNOSIS — R1032 Left lower quadrant pain: Secondary | ICD-10-CM | POA: Diagnosis present

## 2020-09-13 DIAGNOSIS — K219 Gastro-esophageal reflux disease without esophagitis: Secondary | ICD-10-CM | POA: Insufficient documentation

## 2020-09-13 LAB — COMPREHENSIVE METABOLIC PANEL
ALT: 13 U/L (ref 0–44)
AST: 19 U/L (ref 15–41)
Albumin: 3.6 g/dL (ref 3.5–5.0)
Alkaline Phosphatase: 67 U/L (ref 38–126)
Anion gap: 10 (ref 5–15)
BUN: 11 mg/dL (ref 8–23)
CO2: 22 mmol/L (ref 22–32)
Calcium: 8.8 mg/dL — ABNORMAL LOW (ref 8.9–10.3)
Chloride: 105 mmol/L (ref 98–111)
Creatinine, Ser: 0.91 mg/dL (ref 0.44–1.00)
GFR, Estimated: >60 mL/min (ref >60)
Glucose, Bld: 114 mg/dL — ABNORMAL HIGH (ref 70–99)
Potassium: 3.7 mmol/L (ref 3.5–5.1)
Sodium: 137 mmol/L (ref 135–145)
Total Bilirubin: 0.9 mg/dL (ref 0.3–1.2)
Total Protein: 7.2 g/dL (ref 6.5–8.1)

## 2020-09-13 LAB — CBC WITH DIFFERENTIAL/PLATELET
Abs Immature Granulocytes: 0.01 10*3/uL (ref 0.00–0.07)
Basophils Absolute: 0 10*3/uL (ref 0.0–0.1)
Basophils Relative: 1 %
Eosinophils Absolute: 0.2 10*3/uL (ref 0.0–0.5)
Eosinophils Relative: 5 %
HCT: 40.7 % (ref 36.0–46.0)
Hemoglobin: 13.9 g/dL (ref 12.0–15.0)
Immature Granulocytes: 0 %
Lymphocytes Relative: 32 %
Lymphs Abs: 1.1 10*3/uL (ref 0.7–4.0)
MCH: 30.8 pg (ref 26.0–34.0)
MCHC: 34.2 g/dL (ref 30.0–36.0)
MCV: 90.2 fL (ref 80.0–100.0)
Monocytes Absolute: 0.4 10*3/uL (ref 0.1–1.0)
Monocytes Relative: 10 %
Neutro Abs: 1.8 10*3/uL (ref 1.7–7.7)
Neutrophils Relative %: 52 %
Platelets: 202 10*3/uL (ref 150–400)
RBC: 4.51 MIL/uL (ref 3.87–5.11)
RDW: 15.2 % (ref 11.5–15.5)
WBC: 3.4 10*3/uL — ABNORMAL LOW (ref 4.0–10.5)
nRBC: 0 % (ref 0.0–0.2)

## 2020-09-13 LAB — URINALYSIS, ROUTINE W REFLEX MICROSCOPIC
Bilirubin Urine: NEGATIVE
Glucose, UA: NEGATIVE mg/dL
Hgb urine dipstick: NEGATIVE
Ketones, ur: NEGATIVE mg/dL
Leukocytes,Ua: NEGATIVE
Nitrite: NEGATIVE
Protein, ur: NEGATIVE mg/dL
Specific Gravity, Urine: 1.015 (ref 1.005–1.030)
pH: 7 (ref 5.0–8.0)

## 2020-09-13 LAB — LIPASE, BLOOD: Lipase: 30 U/L (ref 11–51)

## 2020-09-13 MED ORDER — IOHEXOL 300 MG/ML  SOLN
100.0000 mL | Freq: Once | INTRAMUSCULAR | Status: AC | PRN
  Administered 2020-09-13: 100 mL via INTRAVENOUS

## 2020-09-13 MED ORDER — POLYETHYLENE GLYCOL 3350 17 G PO PACK: 17.0000 g | PACK | Freq: Every day | ORAL | 0 refills | Status: DC

## 2020-09-13 MED ORDER — DOCUSATE SODIUM 100 MG PO CAPS: 100.0000 mg | ORAL_CAPSULE | Freq: Two times a day (BID) | ORAL | 0 refills | Status: DC

## 2020-09-13 MED ORDER — SODIUM CHLORIDE 0.9 % IV BOLUS
1000.0000 mL | Freq: Once | INTRAVENOUS | Status: AC
  Administered 2020-09-13: 1000 mL via INTRAVENOUS
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
## 2020-09-13 NOTE — ED Notes (Signed)
Pt ambulated to the bathroom SB assist

## 2020-09-13 NOTE — ED Notes (Signed)
Pt has call bell, TV remote and lights turned down  ED process explained

## 2020-09-13 NOTE — ED Notes (Signed)
Pt to CT

## 2020-09-13 NOTE — ED Triage Notes (Signed)
C/o abd pain, points to navel area, states she may be constipated. Has had some nausea, onset approx 5 days ago

## 2021-05-02 ENCOUNTER — Emergency Department (HOSPITAL_BASED_OUTPATIENT_CLINIC_OR_DEPARTMENT_OTHER): Payer: Medicare HMO

## 2021-05-02 ENCOUNTER — Emergency Department (HOSPITAL_BASED_OUTPATIENT_CLINIC_OR_DEPARTMENT_OTHER)
Admission: EM | Admit: 2021-05-02 | Discharge: 2021-05-02 | Disposition: A | Discharge status: Home / Self Care | Payer: Medicare HMO | Attending: Emergency Medicine | Admitting: Emergency Medicine

## 2021-05-02 ENCOUNTER — Encounter (HOSPITAL_BASED_OUTPATIENT_CLINIC_OR_DEPARTMENT_OTHER): Payer: Self-pay

## 2021-05-02 ENCOUNTER — Other Ambulatory Visit: Payer: Self-pay

## 2021-05-02 DIAGNOSIS — Z20822 Contact with and (suspected) exposure to covid-19: Secondary | ICD-10-CM | POA: Diagnosis not present

## 2021-05-02 DIAGNOSIS — R1084 Generalized abdominal pain: Secondary | ICD-10-CM | POA: Insufficient documentation

## 2021-05-02 DIAGNOSIS — Z79899 Other long term (current) drug therapy: Secondary | ICD-10-CM | POA: Diagnosis not present

## 2021-05-02 DIAGNOSIS — I10 Essential (primary) hypertension: Secondary | ICD-10-CM | POA: Diagnosis not present

## 2021-05-02 DIAGNOSIS — E039 Hypothyroidism, unspecified: Secondary | ICD-10-CM | POA: Insufficient documentation

## 2021-05-02 DIAGNOSIS — R1031 Right lower quadrant pain: Secondary | ICD-10-CM | POA: Diagnosis present

## 2021-05-02 DIAGNOSIS — Z7982 Long term (current) use of aspirin: Secondary | ICD-10-CM | POA: Insufficient documentation

## 2021-05-02 LAB — URINALYSIS, MICROSCOPIC (REFLEX)

## 2021-05-02 LAB — RESP PANEL BY RT-PCR (FLU A&B, COVID) ARPGX2
Influenza A by PCR: NEGATIVE
Influenza B by PCR: NEGATIVE
SARS Coronavirus 2 by RT PCR: NEGATIVE

## 2021-05-02 LAB — CBC
HCT: 36.7 % (ref 36.0–46.0)
Hemoglobin: 12.3 g/dL (ref 12.0–15.0)
MCH: 30.8 pg (ref 26.0–34.0)
MCHC: 33.5 g/dL (ref 30.0–36.0)
MCV: 92 fL (ref 80.0–100.0)
Platelets: 223 10*3/uL (ref 150–400)
RBC: 3.99 MIL/uL (ref 3.87–5.11)
RDW: 15.9 % — ABNORMAL HIGH (ref 11.5–15.5)
WBC: 3.8 10*3/uL — ABNORMAL LOW (ref 4.0–10.5)
nRBC: 0 % (ref 0.0–0.2)

## 2021-05-02 LAB — URINALYSIS, ROUTINE W REFLEX MICROSCOPIC
Bilirubin Urine: NEGATIVE
Glucose, UA: NEGATIVE mg/dL
Hgb urine dipstick: NEGATIVE
Ketones, ur: NEGATIVE mg/dL
Nitrite: NEGATIVE
Protein, ur: NEGATIVE mg/dL
Specific Gravity, Urine: 1.02 (ref 1.005–1.030)
pH: 5.5 (ref 5.0–8.0)

## 2021-05-02 LAB — COMPREHENSIVE METABOLIC PANEL
ALT: 13 U/L (ref 0–44)
AST: 20 U/L (ref 15–41)
Albumin: 3.4 g/dL — ABNORMAL LOW (ref 3.5–5.0)
Alkaline Phosphatase: 60 U/L (ref 38–126)
Anion gap: 7 (ref 5–15)
BUN: 13 mg/dL (ref 8–23)
CO2: 24 mmol/L (ref 22–32)
Calcium: 8.5 mg/dL — ABNORMAL LOW (ref 8.9–10.3)
Chloride: 107 mmol/L (ref 98–111)
Creatinine, Ser: 0.93 mg/dL (ref 0.44–1.00)
GFR, Estimated: >60 mL/min (ref >60)
Glucose, Bld: 101 mg/dL — ABNORMAL HIGH (ref 70–99)
Potassium: 4 mmol/L (ref 3.5–5.1)
Sodium: 138 mmol/L (ref 135–145)
Total Bilirubin: 0.5 mg/dL (ref 0.3–1.2)
Total Protein: 6.8 g/dL (ref 6.5–8.1)

## 2021-05-02 LAB — PROTIME-INR
INR: 1 (ref 0.8–1.2)
Prothrombin Time: 13.1 seconds (ref 11.4–15.2)

## 2021-05-02 NOTE — ED Provider Notes (Signed)
Patient signed out to me awaiting CT scan.  Here for nonspecific abdominal pain.  Lab work is unremarkable.  Urinalysis with no obvious infectious process.  CT scan was negative for acute process.  Will send urine culture but will not treat for UTI as she is not having any infectious symptoms.  She has no leukocytosis or fever.  Suspect urine is not a good catch.  Given reassurance and discharged from the ED in good condition.  This chart was dictated using voice recognition software.  Despite best efforts to proofread,  errors can occur which can change the documentation meaning.    Virgina Norfolk, DO 05/02/21 1735
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
## 2021-05-02 NOTE — ED Triage Notes (Signed)
Pt c/o abd pain, weakness x 1 week-denies v/d-states she had diarrhea with the flu ~2 weeks ago-NAD-slow steady gait

## 2021-05-02 NOTE — ED Notes (Signed)
Patient transported to CT 

## 2021-05-04 LAB — URINE CULTURE

## 2021-12-15 IMAGING — CT CT HEAD W/O CM
3 series · 15 of 47 positions shown, 18 images · non-contrast
Comparison: None.

CLINICAL DATA: Stroke, nausea

EXAM:
CT HEAD WITHOUT CONTRAST
TECHNIQUE: Contiguous axial images were obtained from the base of the skull
through the vertex without intravenous contrast.

[Series 2: head wo · axial · 0.45mm/px · z∈[+698,+828]mm · 9 of 32 slices shown, 12 images]
[im 3/32  brain]
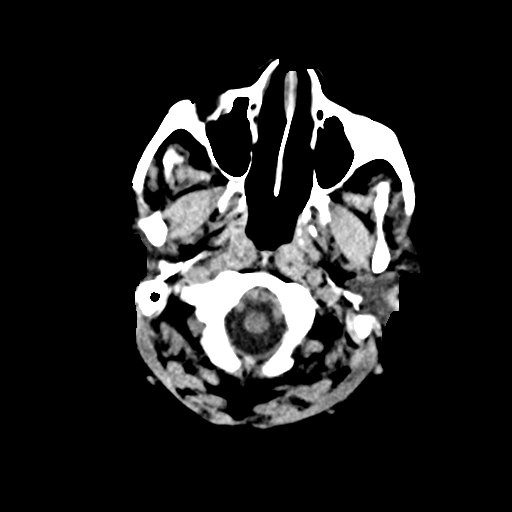
[im 3/32  bone]
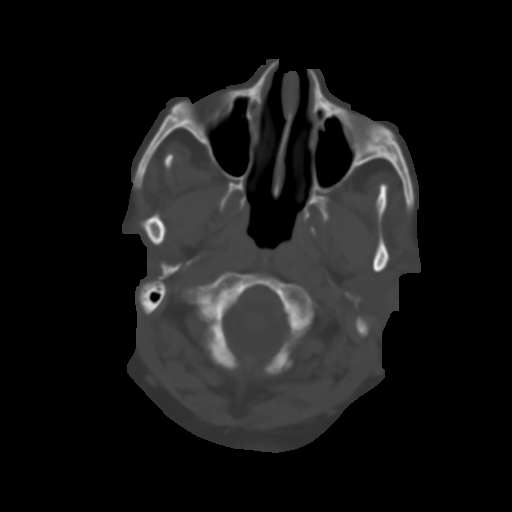
[im 6/32  brain]
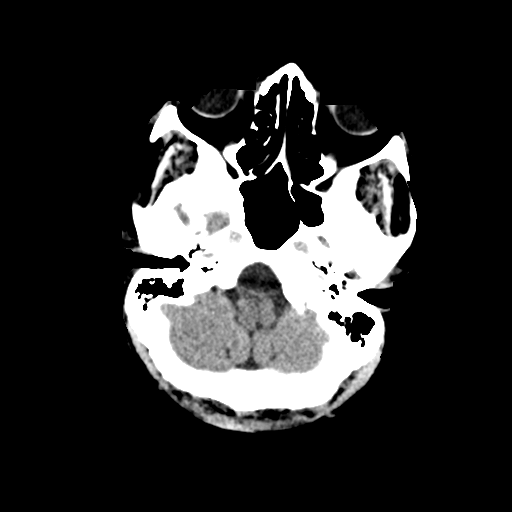
[im 9/32  brain]
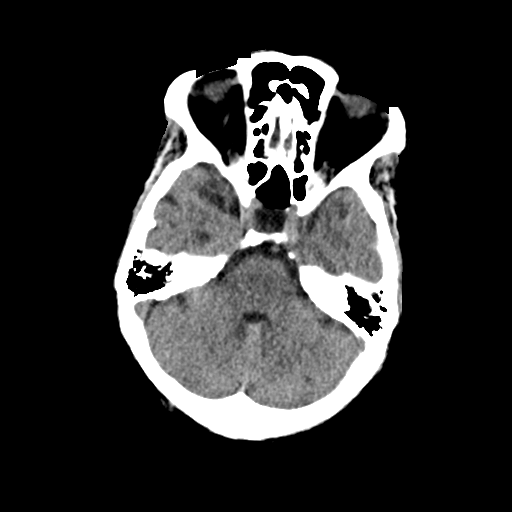
[im 12/32  brain]
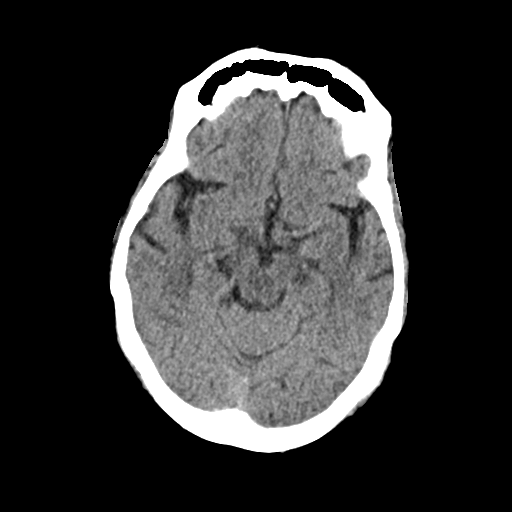
[im 17/32  brain]
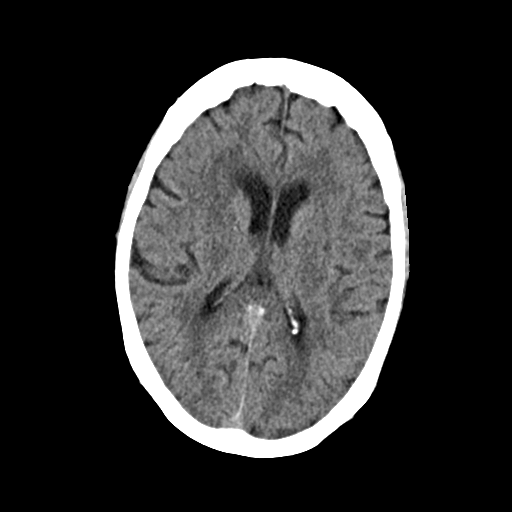
[im 17/32  bone]
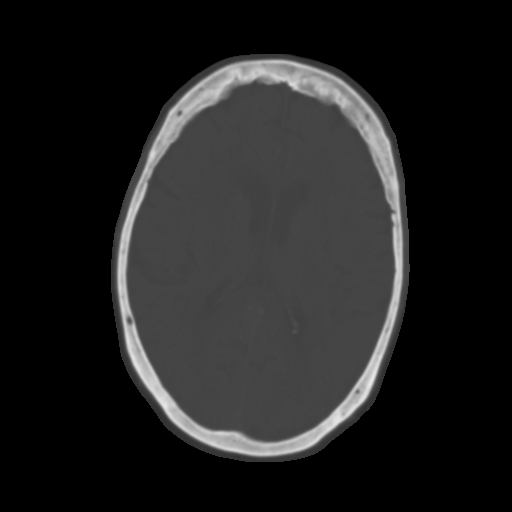
[im 20/32  brain]
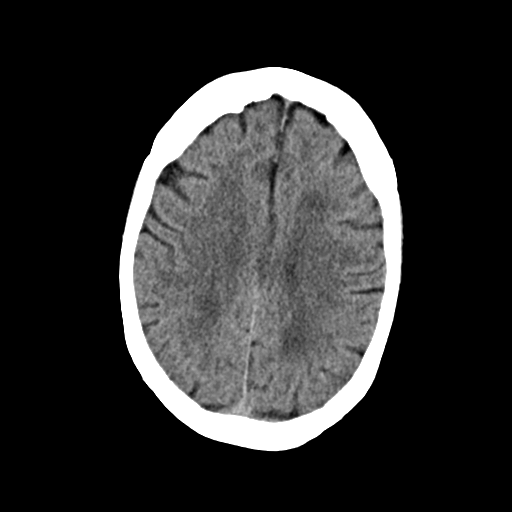
[im 23/32  brain]
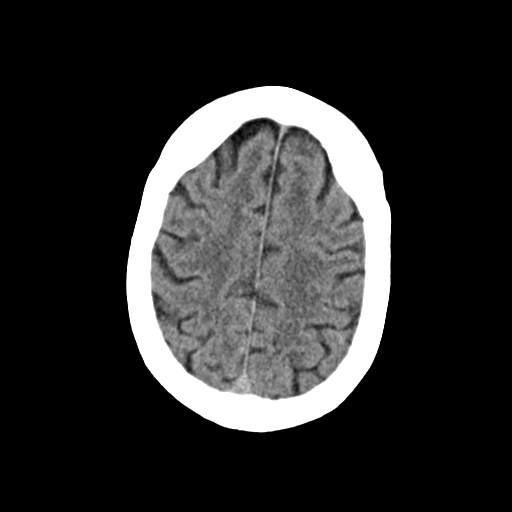
[im 26/32  brain]
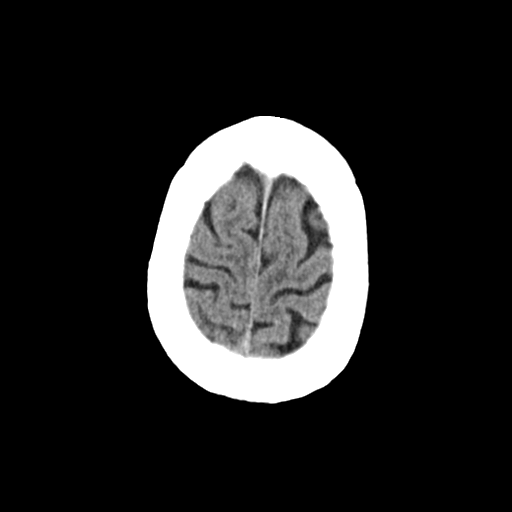
[im 29/32  brain]
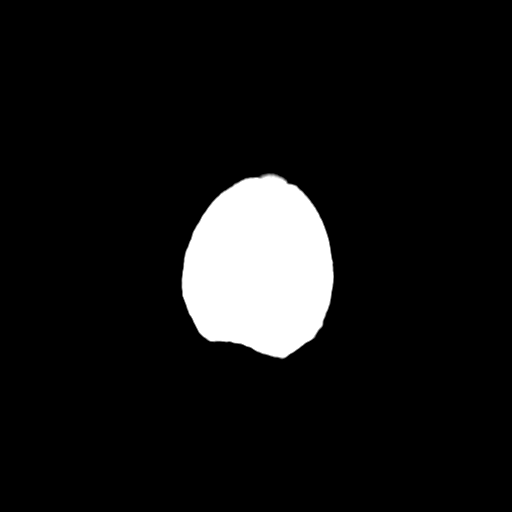
[im 29/32  bone]
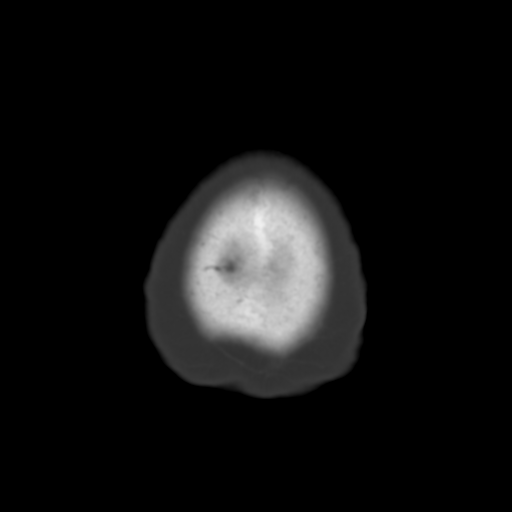

[Series 5: coronal soft · coronal · 0.32mm/px · 3 of 68 slices shown]
[im 23/68  brain]
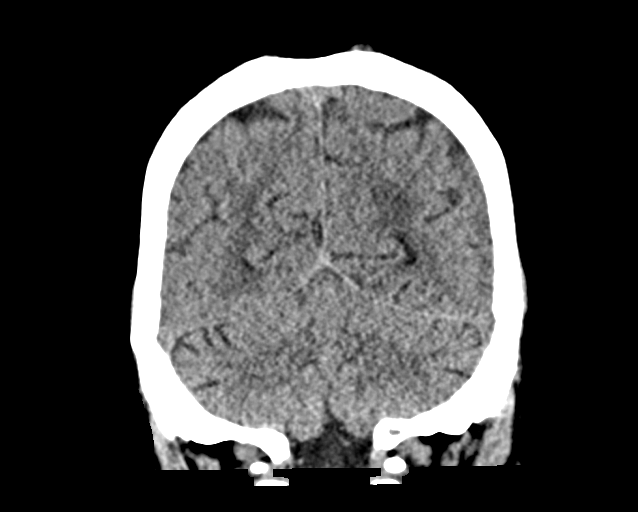
[im 30/68  brain]
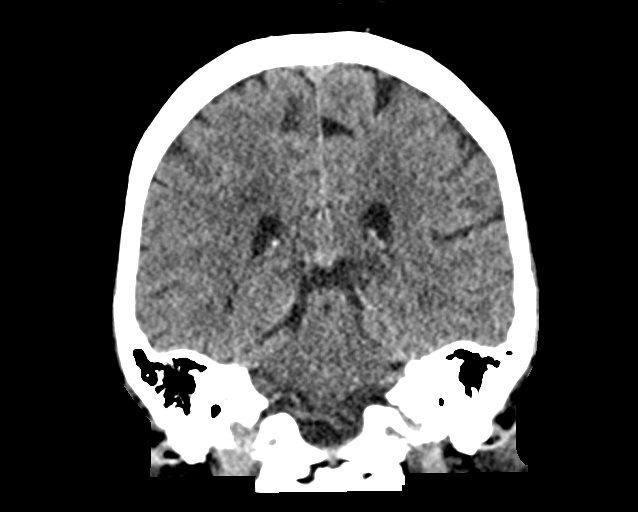
[im 38/68  brain]
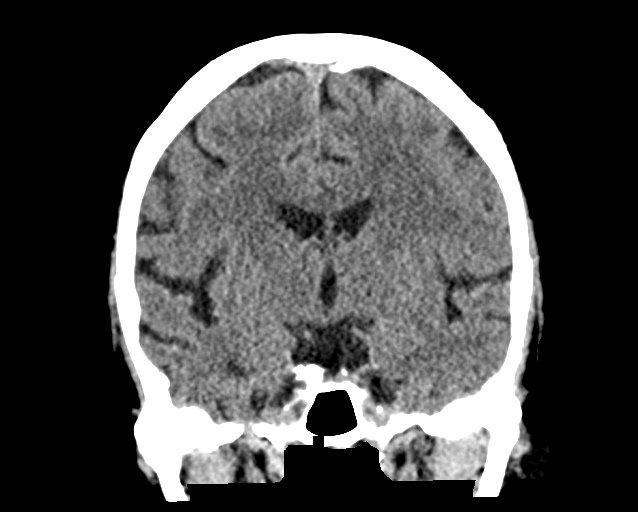

[Series 6: sag soft · sagittal · 0.33mm/px · 3 of 57 slices shown]
[im 19/57  brain]
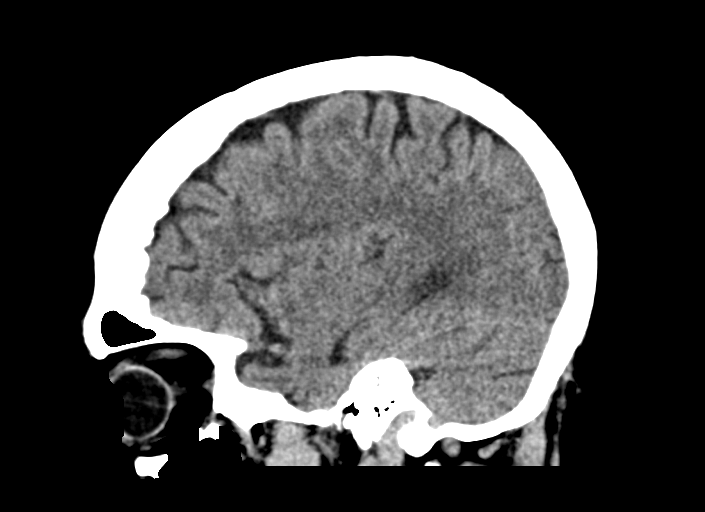
[im 29/57  brain]
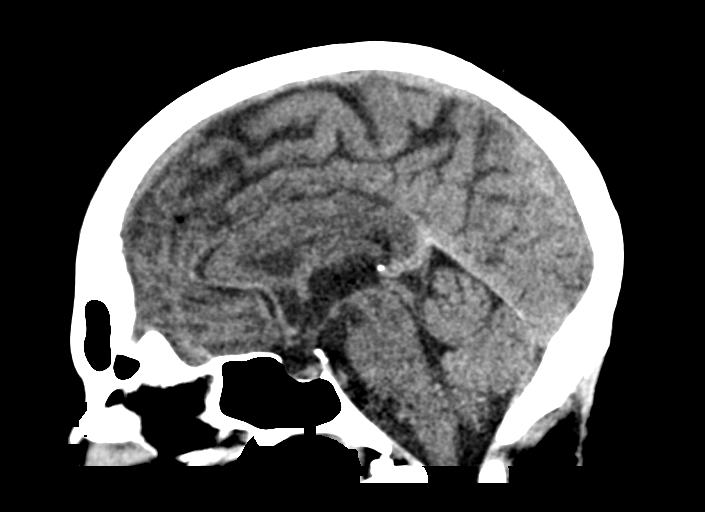
[im 38/57  brain]
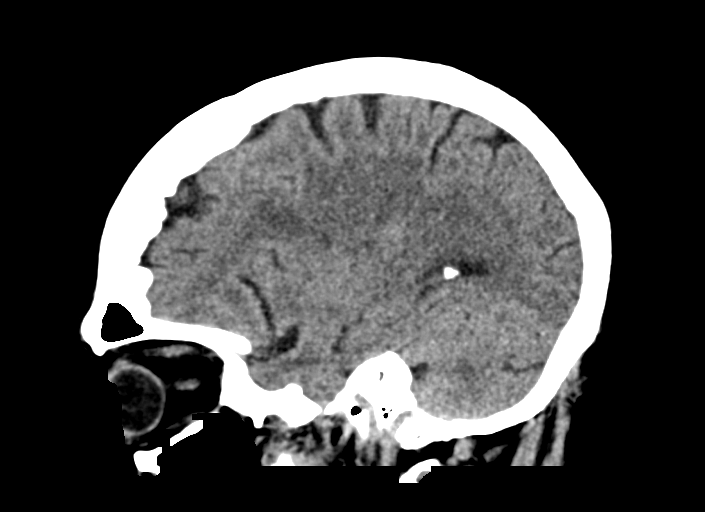

[15 of 47 positions shown; findings below may reference images not displayed]

FINDINGS: Brain: Low-density throughout the white matter compatible chronic
small vessel disease. No acute intracranial abnormality.
Specifically, no hemorrhage, hydrocephalus, mass lesion, acute
infarction, or significant intracranial injury.

Vascular: No hyperdense vessel or unexpected calcification.

Skull: No acute calvarial abnormality.

Sinuses/Orbits: Visualized paranasal sinuses and mastoids clear.
Orbital soft tissues unremarkable.

Other: None
IMPRESSION: Chronic microvascular disease throughout the deep white matter. No
acute intracranial abnormality.

## 2022-06-29 ENCOUNTER — Emergency Department (HOSPITAL_BASED_OUTPATIENT_CLINIC_OR_DEPARTMENT_OTHER)
Admission: EM | Admit: 2022-06-29 | Discharge: 2022-06-29 | Disposition: A | Discharge status: Home / Self Care | Payer: Medicare HMO | Attending: Emergency Medicine | Admitting: Emergency Medicine

## 2022-06-29 ENCOUNTER — Encounter (HOSPITAL_BASED_OUTPATIENT_CLINIC_OR_DEPARTMENT_OTHER): Payer: Self-pay

## 2022-06-29 ENCOUNTER — Emergency Department (HOSPITAL_BASED_OUTPATIENT_CLINIC_OR_DEPARTMENT_OTHER): Payer: Medicare HMO

## 2022-06-29 ENCOUNTER — Other Ambulatory Visit: Payer: Self-pay

## 2022-06-29 DIAGNOSIS — Z7982 Long term (current) use of aspirin: Secondary | ICD-10-CM | POA: Insufficient documentation

## 2022-06-29 DIAGNOSIS — D72819 Decreased white blood cell count, unspecified: Secondary | ICD-10-CM | POA: Diagnosis not present

## 2022-06-29 DIAGNOSIS — Z7901 Long term (current) use of anticoagulants: Secondary | ICD-10-CM | POA: Insufficient documentation

## 2022-06-29 DIAGNOSIS — R42 Dizziness and giddiness: Secondary | ICD-10-CM | POA: Diagnosis present

## 2022-06-29 DIAGNOSIS — U071 COVID-19: Secondary | ICD-10-CM

## 2022-06-29 LAB — CBC WITH DIFFERENTIAL/PLATELET
Abs Immature Granulocytes: 0.01 10*3/uL (ref 0.00–0.07)
Basophils Absolute: 0 10*3/uL (ref 0.0–0.1)
Basophils Relative: 0 %
Eosinophils Absolute: 0.1 10*3/uL (ref 0.0–0.5)
Eosinophils Relative: 3 %
HCT: 36.3 % (ref 36.0–46.0)
Hemoglobin: 12.4 g/dL (ref 12.0–15.0)
Immature Granulocytes: 0 %
Lymphocytes Relative: 43 %
Lymphs Abs: 1.4 10*3/uL (ref 0.7–4.0)
MCH: 30.8 pg (ref 26.0–34.0)
MCHC: 34.2 g/dL (ref 30.0–36.0)
MCV: 90.3 fL (ref 80.0–100.0)
Monocytes Absolute: 0.3 10*3/uL (ref 0.1–1.0)
Monocytes Relative: 8 %
Neutro Abs: 1.5 10*3/uL — ABNORMAL LOW (ref 1.7–7.7)
Neutrophils Relative %: 46 %
Platelets: 198 10*3/uL (ref 150–400)
RBC: 4.02 MIL/uL (ref 3.87–5.11)
RDW: 15.4 % (ref 11.5–15.5)
WBC: 3.3 10*3/uL — ABNORMAL LOW (ref 4.0–10.5)
nRBC: 0 % (ref 0.0–0.2)

## 2022-06-29 LAB — COMPREHENSIVE METABOLIC PANEL
ALT: 17 U/L (ref 0–44)
AST: 20 U/L (ref 15–41)
Albumin: 3.4 g/dL — ABNORMAL LOW (ref 3.5–5.0)
Alkaline Phosphatase: 63 U/L (ref 38–126)
Anion gap: 6 (ref 5–15)
BUN: 14 mg/dL (ref 8–23)
CO2: 25 mmol/L (ref 22–32)
Calcium: 8.3 mg/dL — ABNORMAL LOW (ref 8.9–10.3)
Chloride: 104 mmol/L (ref 98–111)
Creatinine, Ser: 0.73 mg/dL (ref 0.44–1.00)
GFR, Estimated: >60 mL/min (ref >60)
Glucose, Bld: 90 mg/dL (ref 70–99)
Potassium: 3.2 mmol/L — ABNORMAL LOW (ref 3.5–5.1)
Sodium: 135 mmol/L (ref 135–145)
Total Bilirubin: 0.7 mg/dL (ref 0.3–1.2)
Total Protein: 6.8 g/dL (ref 6.5–8.1)

## 2022-06-29 MED ORDER — PROCHLORPERAZINE EDISYLATE 10 MG/2ML IJ SOLN
5.0000 mg | Freq: Once | INTRAMUSCULAR | Status: AC
  Administered 2022-06-29: 5 mg via INTRAVENOUS
  Filled 2022-06-29: qty 2

## 2022-06-29 MED ORDER — MECLIZINE HCL 25 MG PO TABS
25.0000 mg | ORAL_TABLET | Freq: Once | ORAL | Status: AC
  Administered 2022-06-29: 25 mg via ORAL
  Filled 2022-06-29: qty 1

## 2022-06-29 MED ORDER — DEXAMETHASONE 4 MG PO TABS
10.0000 mg | ORAL_TABLET | Freq: Once | ORAL | Status: AC
  Administered 2022-06-29: 10 mg via ORAL
  Filled 2022-06-29: qty 3

## 2022-06-29 MED ORDER — AMOXICILLIN-POT CLAVULANATE 875-125 MG PO TABS
1.0000 | ORAL_TABLET | Freq: Once | ORAL | Status: AC
  Administered 2022-06-29: 1 via ORAL
  Filled 2022-06-29: qty 1

## 2022-06-29 MED ORDER — SODIUM CHLORIDE 0.9 % IV BOLUS
1000.0000 mL | Freq: Once | INTRAVENOUS | Status: AC
  Administered 2022-06-29: 1000 mL via INTRAVENOUS

## 2022-06-29 MED ORDER — DIPHENHYDRAMINE HCL 50 MG/ML IJ SOLN
12.5000 mg | Freq: Once | INTRAMUSCULAR | Status: AC
  Administered 2022-06-29: 12.5 mg via INTRAVENOUS
  Filled 2022-06-29: qty 1

## 2022-06-29 MED ORDER — AMOXICILLIN-POT CLAVULANATE 875-125 MG PO TABS: 1.0000 | ORAL_TABLET | Freq: Two times a day (BID) | ORAL | 0 refills | Status: DC

## 2022-06-29 MED ORDER — MECLIZINE HCL 25 MG PO TABS: 25.0000 mg | ORAL_TABLET | Freq: Three times a day (TID) | ORAL | 0 refills | Status: DC | PRN

## 2022-06-29 NOTE — ED Notes (Signed)
Pts son, Legrand Como, notified of pts d/c status.  He reports he is on the way to pick up pt.

## 2022-06-29 NOTE — ED Notes (Signed)
Patient stated they felt dizzy at first.  Patient ambulated to bathroom an stated when she got back in bed that she felt better

## 2022-06-29 NOTE — ED Notes (Signed)
IV Removed

## 2022-06-29 NOTE — Discharge Instructions (Signed)
I prescribed you a medicine that can help you with the sensation of dizziness.  I have also prescribed you an antibiotic for possible sinus infection.  Please return for inability to walk worsening dizziness one-sided numbness or weakness or difficulty speech or swallowing.

## 2022-06-29 NOTE — ED Provider Notes (Signed)
[facility] Provider Note   CSN: [id number] Arrival date & time: 06/29/22  1545     History  Chief Complaint  Patient presents with   Dizziness    Cheryl Palmer is a 82 y.o. female.  82 yo F with a chief complaint of dizziness.  Going on for about 4 days or so.  She felt that she was so dizzy that she actually ended up falling earlier today.  She has trouble describing what dizziness means.  Seems to be worse with head movement and eye movement and standing trying to ambulate.  She denies headache denies ear pain. She was diagnosed with COVID about 4 to 5 days ago.  She is now taking Paxlovid and listed on her handout for side effects was dizziness with taking amlodipine.  She thinks that the cause of her symptoms.    Dizziness      Home Medications Prior to Admission medications   Medication Sig Start Date End Date Taking? Authorizing Provider  amLODipine (NORVASC) 10 MG tablet Take 1 tablet (10 mg total) by mouth daily. 11/22/16   Isla Pence, MD  aspirin 81 MG tablet Take 81 mg by mouth daily.      [provider]  bisacodyl (DULCOLAX) 5 MG EC tablet Take 5 mg by mouth daily as needed.      [provider]  carvedilol (COREG) 25 MG tablet Take 50 mg by mouth daily.      [provider]  clonazePAM Bobbye Charleston) 0.5 MG tablet Take one tablet by mouth twice daily 04/28/13   Reed, Tiffany L, DO  cyclobenzaprine (FLEXERIL) 5 MG tablet Take 1 tablet (5 mg total) by mouth 2 (two) times daily as needed for up to 10 doses for muscle spasms. 05/01/20   Curatolo, Adam, DO  docusate sodium (COLACE) 100 MG capsule Take 1 capsule (100 mg total) by mouth 2 (two) times daily. 09/13/20   Curatolo, Adam, DO  eszopiclone (LUNESTA) 2 MG TABS tablet Take 2 mg by mouth at bedtime as needed for sleep. Take immediately before bedtime    [provider]  famotidine (PEPCID) 20 MG tablet Take 20 mg by mouth 2 (two)  times daily.      [provider]  furosemide (LASIX) 40 MG tablet Take 40 mg by mouth.    [provider]  levothyroxine (SYNTHROID, LEVOTHROID) 150 MCG tablet Take 137 mcg by mouth every morning.     [provider]  LORazepam (ATIVAN) 0.5 MG tablet Take 1 tablet (0.5 mg total) by mouth at bedtime. 11/04/19   Couture, Cortni S, PA-C  losartan (COZAAR) 100 MG tablet Take 100 mg by mouth daily.    [provider]  losartan-hydrochlorothiazide (HYZAAR) 50-12.5 MG per tablet Take 1 tablet by mouth every morning.      [provider]  meclizine (ANTIVERT) 25 MG tablet Take 1 tablet (25 mg total) by mouth 3 (three) times daily as needed for up to 20 doses for dizziness. 12/12/18   Lennice Sites, DO  methylPREDNISolone (MEDROL DOSEPAK) 4 MG TBPK tablet Follow package insert 05/01/20   Curatolo, Adam, DO  nitrofurantoin, macrocrystal-monohydrate, (MACROBID) 100 MG capsule Take 100 mg by mouth 2 (two) times daily.    [provider]  omeprazole (PRILOSEC OTC) 20 MG tablet Take 20 mg by mouth daily.      [provider]  OVER THE COUNTER MEDICATION Take 2 tablets by mouth daily. Pressur-Lo multivitamin, mineral,  and herb supplement to support cardiovascular health     [provider]  pantoprazole (PROTONIX) 40 MG tablet Take 40 mg by mouth daily.    [provider]  perphenazine (TRILAFON) 4 MG tablet Take 4 mg by mouth 2 (two) times daily.    [provider]  polyethylene glycol (MIRALAX / GLYCOLAX) 17 g packet Take 17 g by mouth daily. 09/13/20   Curatolo, Adam, DO  potassium chloride (KLOR-CON) 10 MEQ CR tablet Take 10 mEq by mouth 3 (three) times daily.      [provider]  Rivaroxaban (XARELTO PO) Take by mouth.    [provider]  solifenacin (VESICARE) 5 MG tablet Take 5 mg by mouth daily.      [provider]  sucralfate (CARAFATE) 1 GM/10ML suspension Take 10 mLs (1 g total) by mouth  4 (four) times daily -  with meals and at bedtime. 09/30/19   Volanda Napoleon, PA-C  Tamsulosin HCl (FLOMAX) 0.4 MG CAPS Take 0.4 mg by mouth daily.      [provider]  traMADol (ULTRAM) 50 MG tablet Take 1 tablet (50 mg total) by mouth every 6 (six) hours as needed. 06/15/19   Veryl Speak, MD  Warfarin Sodium (COUMADIN PO) Take by mouth.    [provider]  zolpidem (AMBIEN) 5 MG tablet Take 5 mg by mouth at bedtime as needed. For sleep     [provider]      Allergies    Chlorpheniramine-acetaminophen    Review of Systems   Review of Systems  Neurological:  Positive for dizziness.    Physical Exam Updated Vital Signs BP 138/80 (BP Location: Left Arm)   Pulse 69   Temp 98.5 F (36.9 C) (Oral)   Resp 20   Ht 5' 3"$  (1.6 m)   Wt 101.2 kg   SpO2 99%   BMI 39.50 kg/m  Physical Exam Vitals and nursing note reviewed.  Constitutional:      General: She is not in acute distress.    Appearance: She is well-developed. She is not diaphoretic.  HENT:     Head: Normocephalic and atraumatic.  Eyes:     Pupils: Pupils are equal, round, and reactive to light.  Cardiovascular:     Rate and Rhythm: Normal rate and regular rhythm.     Heart sounds: No murmur heard.    No friction rub. No gallop.  Pulmonary:     Effort: Pulmonary effort is normal.     Breath sounds: No wheezing or rales.  Abdominal:     General: There is no distension.     Palpations: Abdomen is soft.     Tenderness: There is no abdominal tenderness.  Musculoskeletal:        General: No tenderness.     Cervical back: Normal range of motion and neck supple.  Skin:    General: Skin is warm and dry.  Neurological:     Mental Status: She is alert and oriented to person, place, and time.     Comments: Left-sided fast going nystagmus.  Left TM with effusion.  Otherwise benign neurologic exam.  Able to ambulate but seems unsteady with ambulation.  Psychiatric:        Behavior: Behavior  normal.     ED Results / Procedures / Treatments   Labs (all labs ordered are listed, but only abnormal results are displayed) Labs Reviewed  CBC WITH DIFFERENTIAL/PLATELET  COMPREHENSIVE METABOLIC PANEL    EKG  None  Radiology No results found.  Procedures Procedures    Medications Ordered in ED Medications  sodium chloride 0.9 % bolus 1,000 mL (has no administration in time range)  prochlorperazine (COMPAZINE) injection 5 mg (has no administration in time range)  diphenhydrAMINE (BENADRYL) injection 12.5 mg (has no administration in time range)  meclizine (ANTIVERT) tablet 25 mg (has no administration in time range)    ED Course/ Medical Decision Making/ A&P                             Medical Decision Making Amount and/or Complexity of Data Reviewed Labs: ordered. Radiology: ordered.  Risk Prescription drug management.   82 yo F with a chief complaints of dizziness.  This started about 4 to 5 days ago.  She also recently started treatment for Paxlovid.  She thinks that they are related.  She does take amlodipine.  The patient has had improvement of her symptoms with a headache cocktail meclizine and IV fluids.  She has mild leukopenia which could be due to her having COVID.  No significant electrolyte abnormality.  CT of the head was negative for acute intracranial pathology.  There was some concern for sinusitis on CT scan.  Patient able to ambulate here independently.  Will treat for possible sinusitis.  I did discuss risk and benefits of having her transferred to [facility] for MRI, she declines at this time.  PCP follow-up.  6:27 PM:  I have discussed the diagnosis/risks/treatment options with the patient.  Evaluation and diagnostic testing in the emergency department does not suggest an emergent condition requiring admission or immediate intervention beyond what has been performed at this time.  They will follow up with PCP. We also discussed returning to the  ED immediately if new or worsening sx occur. We discussed the sx which are most concerning (e.g., sudden worsening pain, fever, inability to tolerate by mouth) that necessitate immediate return. Medications administered to the patient during their visit and any new prescriptions provided to the patient are listed below.  Medications given during this visit Medications  dexamethasone (DECADRON) tablet 10 mg (has no administration in time range)  amoxicillin-clavulanate (AUGMENTIN) 875-125 MG per tablet 1 tablet (has no administration in time range)  sodium chloride 0.9 % bolus 1,000 mL (1,000 mLs Intravenous New Bag/Given 06/29/22 1653)  prochlorperazine (COMPAZINE) injection 5 mg (5 mg Intravenous Given 06/29/22 1654)  diphenhydrAMINE (BENADRYL) injection 12.5 mg (12.5 mg Intravenous Given 06/29/22 1655)  meclizine (ANTIVERT) tablet 25 mg (25 mg Oral Given 06/29/22 1654)     The patient appears reasonably screen and/or stabilized for discharge and I doubt any other medical condition or other [facility] requiring further screening, evaluation, or treatment in the ED at this time prior to discharge.          Final Clinical Impression(s) / ED Diagnoses Final diagnoses:  None    Rx / DC Orders ED Discharge Orders     None         Deno Etienne, DO 06/29/22 1827

## 2022-06-29 NOTE — ED Notes (Signed)
D/c paperwork reviewed with pt, including prescriptions and follow up care.  All questions and/or concerns addressed at time of d/c.  No further needs expressed. . Pt verbalized understanding, Wheeled by ED staff to ED exit, NAD.

## 2022-06-29 NOTE — ED Triage Notes (Addendum)
Pt arrives with c/o dizziness that started last Friday. Pt tested positive for COVID last Tuesday and started Paxlovid and she believes that it is making her dizzy. Pt denies SOB or CP. Pt believes that the Paxlovid is lowering her BP and she is worried about taking her BP meds. Per pt, she did have a fall today and hit her right shoulder and knee. Pt denies LOC or hitting her head. Pt denies blood thinners.

## 2022-07-02 ENCOUNTER — Telehealth (HOSPITAL_COMMUNITY): Payer: Self-pay

## 2022-09-12 IMAGING — CT CT ABD-PELV W/O CM
2 of 4 series · 17 of 46 positions shown, 19 images · non-contrast
Comparison: CT 09/13/2020

CLINICAL DATA: Abdominal pain, acute, nonlocalized

EXAM:
CT ABDOMEN AND PELVIS WITHOUT CONTRAST
TECHNIQUE: Multidetector CT imaging of the abdomen and pelvis was performed
following the standard protocol without IV contrast.

[Series 3: axial st · axial · 0.98mm/px · z∈[-305,+105]mm · 14 of 92 slices shown, 16 images]
[im 5/92  soft-tissue]
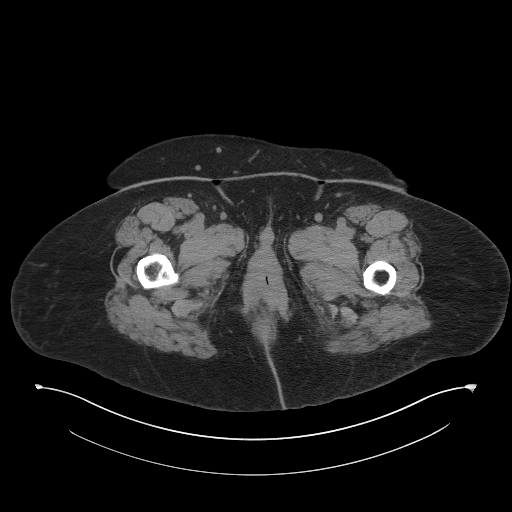
[im 5/92  bone]
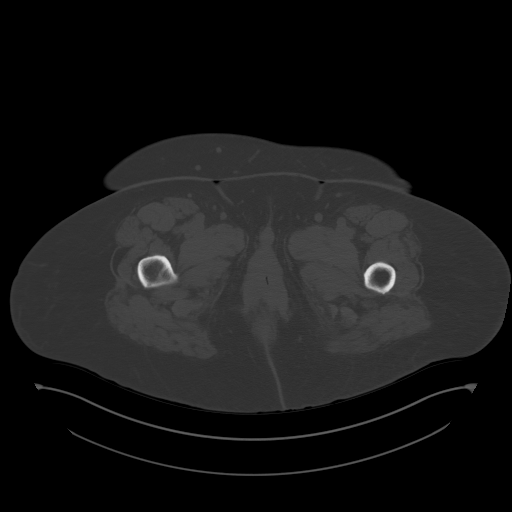
[im 14/92  soft-tissue]
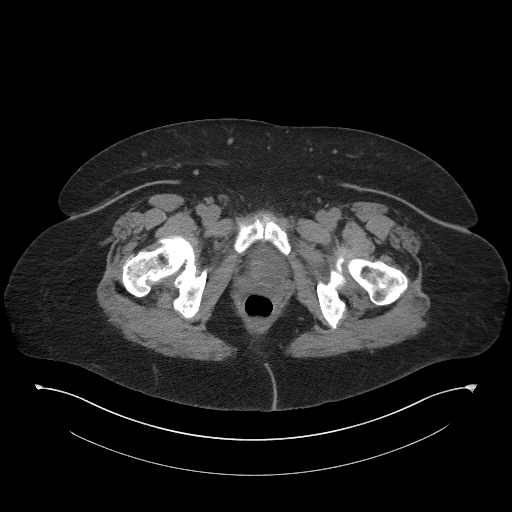
[im 18/92  soft-tissue]
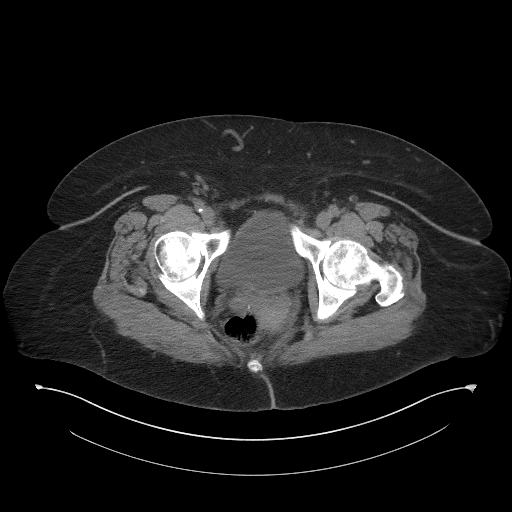
[im 27/92  soft-tissue]
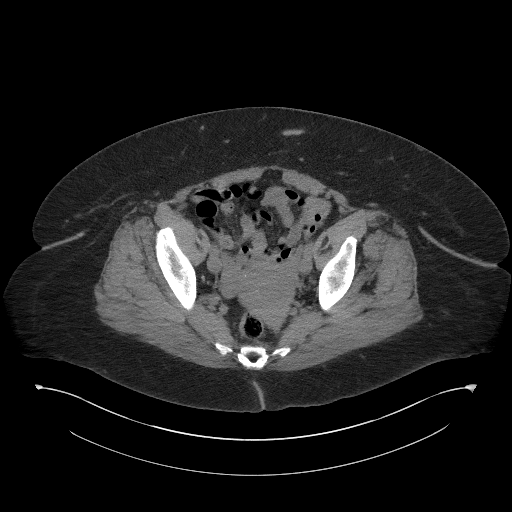
[im 31/92  soft-tissue]
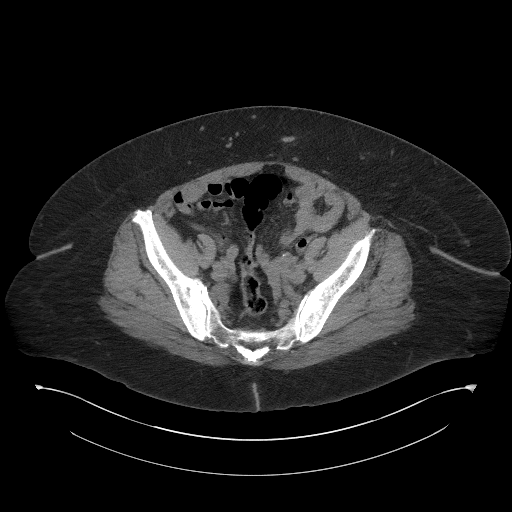
[im 35/92  soft-tissue]
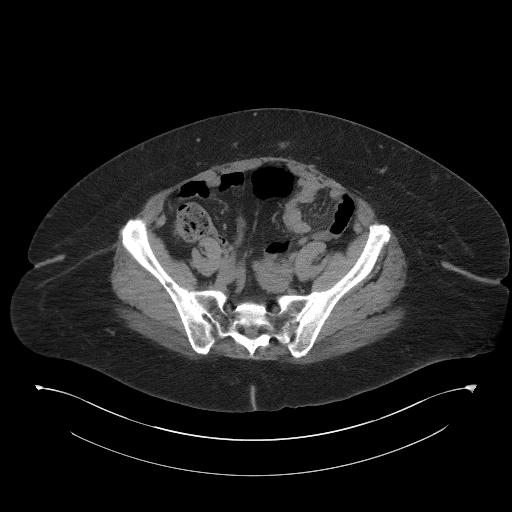
[im 44/92  soft-tissue]
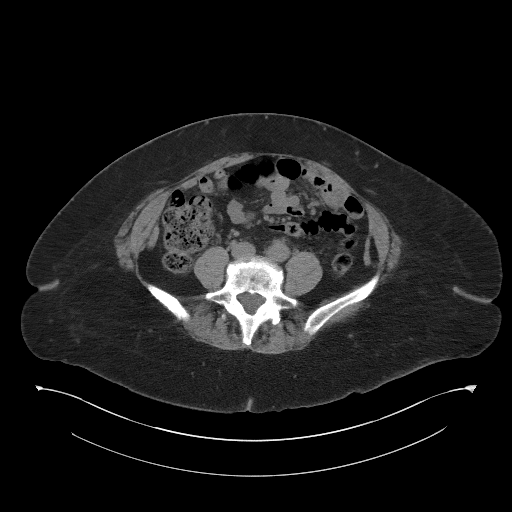
[im 48/92  soft-tissue]
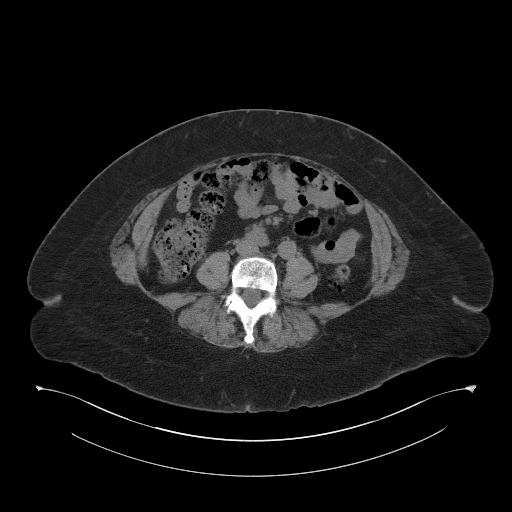
[im 57/92  soft-tissue]
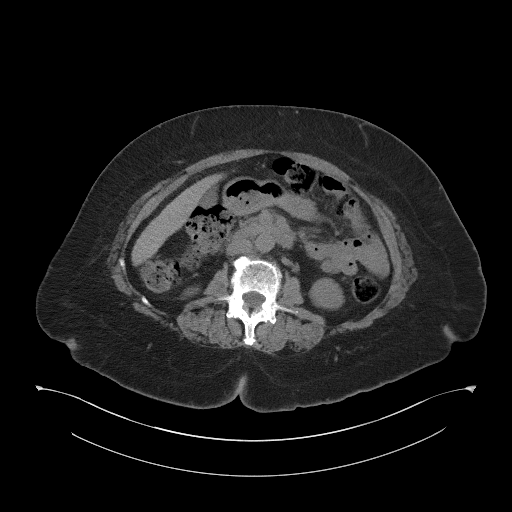
[im 57/92  bone]
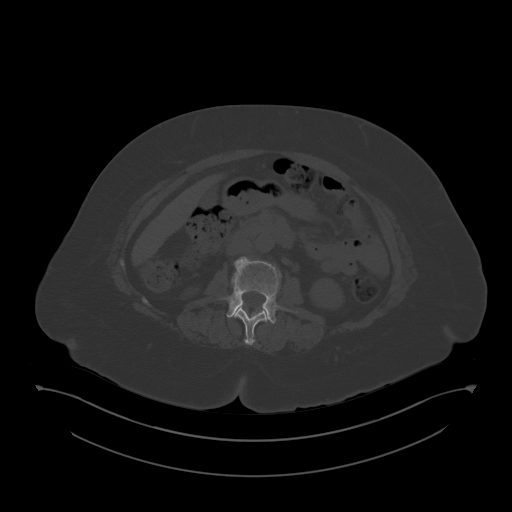
[im 61/92  soft-tissue]
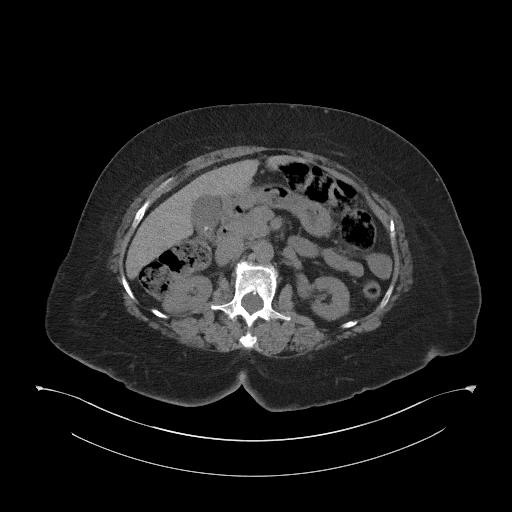
[im 70/92  soft-tissue]
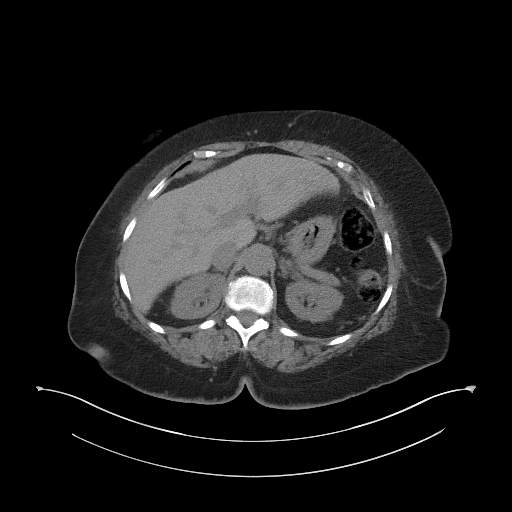
[im 74/92  soft-tissue]
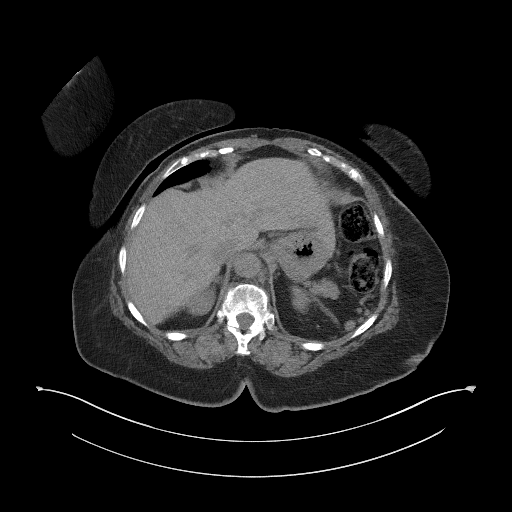
[im 79/92  soft-tissue]
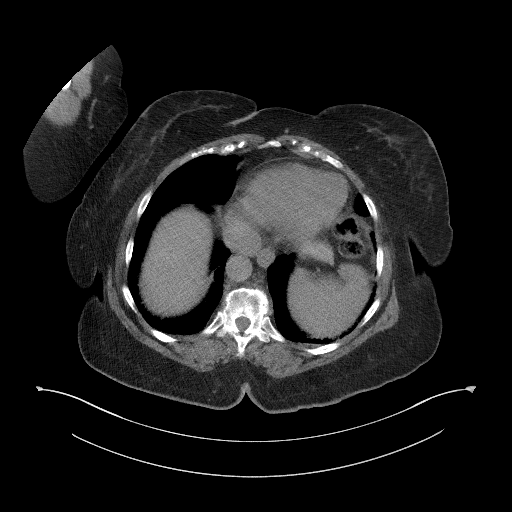
[im 87/92  soft-tissue]
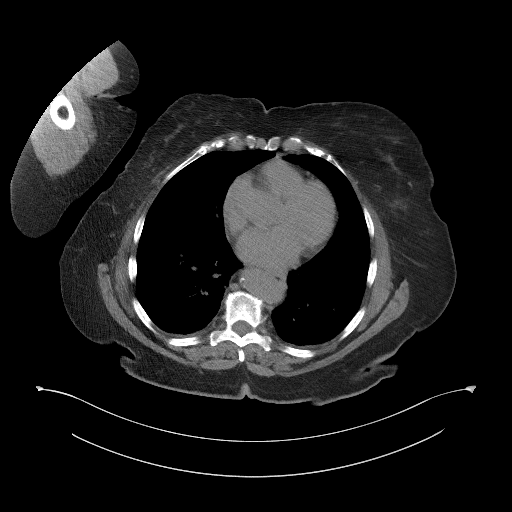

[Series 6: coronal st · coronal · 0.93mm/px · 3 of 121 slices shown]
[im 41/121  soft-tissue]
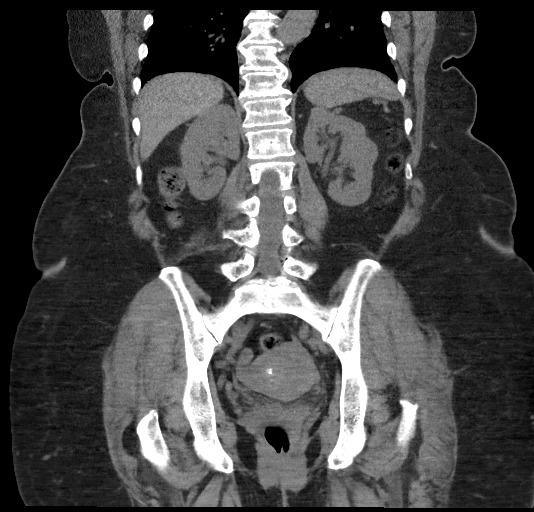
[im 54/121  soft-tissue]
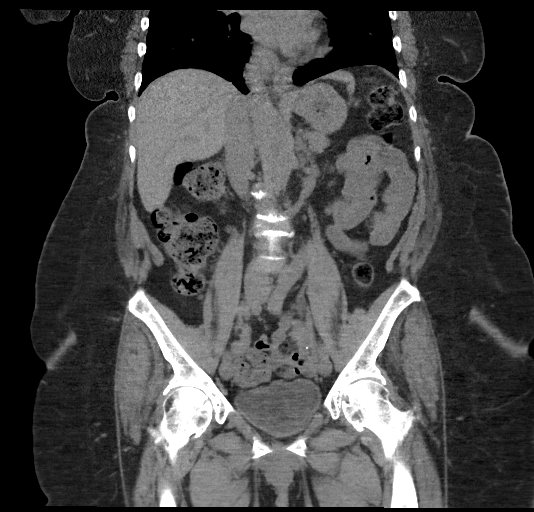
[im 67/121  soft-tissue]
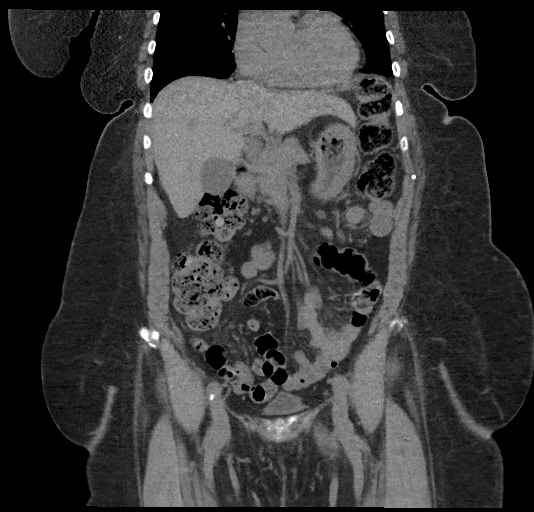

[17 of 46 positions shown; findings below may reference images not displayed]

FINDINGS: Lower chest: No acute abnormality.

Hepatobiliary: No focal liver abnormality is seen. Cholelithiasis.
No evidence of acute cholecystitis.

Pancreas: Unremarkable. No pancreatic ductal dilatation or
surrounding inflammatory changes.

Spleen: Normal in size without focal abnormality.

Adrenals/Urinary Tract: Adrenal glands are unremarkable. No
hydronephrosis or nephrolithiasis. Bladder is unremarkable.

Stomach/Bowel: Stomach is within normal limits. There is no evidence
of bowel obstruction. The appendix is normal. Scattered colonic
diverticula. There is no acute inflammatory process involving the
bowel.

Vascular/Lymphatic: No significant vascular findings are present. No
enlarged abdominal or pelvic lymph nodes.

Reproductive: Calcified uterine fibroids, similar prior exam.

Other: No abdominal wall hernia or abnormality. No abdominopelvic
ascites.

Musculoskeletal: No acute osseous abnormality. No suspicious lytic
or blastic lesions. There is moderate bilateral hip osteoarthritis.
Multilevel degenerative changes of the spine.
IMPRESSION: No acute abdominopelvic abnormality.  Normal appendix.

Cholelithiasis.  No evidence of acute cholecystitis.

## 2023-05-24 ENCOUNTER — Emergency Department (HOSPITAL_BASED_OUTPATIENT_CLINIC_OR_DEPARTMENT_OTHER): Payer: Medicare HMO

## 2023-05-24 ENCOUNTER — Emergency Department (HOSPITAL_BASED_OUTPATIENT_CLINIC_OR_DEPARTMENT_OTHER)
Admission: EM | Admit: 2023-05-24 | Discharge: 2023-05-24 | Disposition: A | Discharge status: Home / Self Care | Payer: Medicare HMO | Attending: Emergency Medicine | Admitting: Emergency Medicine

## 2023-05-24 ENCOUNTER — Encounter (HOSPITAL_BASED_OUTPATIENT_CLINIC_OR_DEPARTMENT_OTHER): Payer: Self-pay | Admitting: Emergency Medicine

## 2023-05-24 ENCOUNTER — Other Ambulatory Visit: Payer: Self-pay

## 2023-05-24 DIAGNOSIS — Z79899 Other long term (current) drug therapy: Secondary | ICD-10-CM | POA: Diagnosis not present

## 2023-05-24 DIAGNOSIS — K8020 Calculus of gallbladder without cholecystitis without obstruction: Secondary | ICD-10-CM | POA: Diagnosis not present

## 2023-05-24 DIAGNOSIS — R350 Frequency of micturition: Secondary | ICD-10-CM | POA: Diagnosis not present

## 2023-05-24 DIAGNOSIS — R1011 Right upper quadrant pain: Secondary | ICD-10-CM | POA: Diagnosis present

## 2023-05-24 LAB — URINALYSIS, ROUTINE W REFLEX MICROSCOPIC
Bilirubin Urine: NEGATIVE
Glucose, UA: NEGATIVE mg/dL
Ketones, ur: NEGATIVE mg/dL
Nitrite: NEGATIVE
Protein, ur: NEGATIVE mg/dL
Specific Gravity, Urine: 1.01 (ref 1.005–1.030)
pH: 5 (ref 5.0–8.0)

## 2023-05-24 LAB — BASIC METABOLIC PANEL
Anion gap: 9 (ref 5–15)
BUN: 17 mg/dL (ref 8–23)
CO2: 22 mmol/L (ref 22–32)
Calcium: 8.5 mg/dL — ABNORMAL LOW (ref 8.9–10.3)
Chloride: 107 mmol/L (ref 98–111)
Creatinine, Ser: 0.99 mg/dL (ref 0.44–1.00)
GFR, Estimated: 57 mL/min — ABNORMAL LOW (ref >60)
Glucose, Bld: 102 mg/dL — ABNORMAL HIGH (ref 70–99)
Potassium: 3.5 mmol/L (ref 3.5–5.1)
Sodium: 138 mmol/L (ref 135–145)

## 2023-05-24 LAB — CBC
HCT: 38.9 % (ref 36.0–46.0)
Hemoglobin: 13.1 g/dL (ref 12.0–15.0)
MCH: 31.6 pg (ref 26.0–34.0)
MCHC: 33.7 g/dL (ref 30.0–36.0)
MCV: 94 fL (ref 80.0–100.0)
Platelets: 142 10*3/uL — ABNORMAL LOW (ref 150–400)
RBC: 4.14 MIL/uL (ref 3.87–5.11)
RDW: 16.3 % — ABNORMAL HIGH (ref 11.5–15.5)
WBC: 3.7 10*3/uL — ABNORMAL LOW (ref 4.0–10.5)
nRBC: 0 % (ref 0.0–0.2)

## 2023-05-24 LAB — URINALYSIS, MICROSCOPIC (REFLEX)

## 2023-05-24 LAB — HEPATIC FUNCTION PANEL
ALT: 17 U/L (ref 0–44)
AST: 22 U/L (ref 15–41)
Albumin: 3.5 g/dL (ref 3.5–5.0)
Alkaline Phosphatase: 42 U/L (ref 38–126)
Bilirubin, Direct: 0.1 mg/dL (ref 0.0–0.2)
Indirect Bilirubin: 0.5 mg/dL (ref 0.3–0.9)
Total Bilirubin: 0.6 mg/dL (ref 0.0–1.2)
Total Protein: 6.7 g/dL (ref 6.5–8.1)

## 2023-05-24 LAB — LIPASE, BLOOD: Lipase: 30 U/L (ref 11–51)

## 2023-05-24 MED ORDER — IOHEXOL 300 MG/ML  SOLN
100.0000 mL | Freq: Once | INTRAMUSCULAR | Status: AC | PRN
  Administered 2023-05-24: 100 mL via INTRAVENOUS

## 2023-05-24 NOTE — Discharge Instructions (Signed)
 We saw in the emergency room for abdominal pain.  Our workup shows that you have gallstones.  It appears to me that most likely you are having intermittent abdominal pain because of gallstones.  Please call the general surgeons at the number provided to set up an appointment.  Please return to the ER if your symptoms worsen; you have increased pain, fevers, chills, inability to keep any medications down, confusion. Otherwise see the outpatient doctor as requested.

## 2023-05-24 NOTE — ED Provider Notes (Signed)
 [place] [facility] AT [facility] Provider Note   CSN: [id number] Arrival date & time: 05/24/23  1111     History  Chief Complaint  Patient presents with   Dysuria    Cheryl Palmer is a 83 y.o. female.  HPI    83 year old patient comes in with chief complaint of abdominal pain. Patient states that for the last day she has been having some urinary hesitancy.  She denies any burning with urination as stated in the triage note.  She just simply says that she is having some urinary frequency, and lower quadrant abdominal pain.  Upon questioning the location of the pain, patient is actually pointing in the right upper quadrant.  She states that the pain is intermittent, and not related to urine.  She does not have any specific evoking, aggravating or relieving factor.  Pain is mild to moderate in nature.  No history of similar pain in the past.  She has not seen any blood in her urine or stools.  She denies any diarrhea, URI-like symptoms.  She has no abdominal surgical history.   Home Medications Prior to Admission medications   Medication Sig Start Date End Date Taking? Authorizing Provider  amLODipine  (NORVASC ) 10 MG tablet Take 1 tablet (10 mg total) by mouth daily. 11/22/16   Haviland, Julie, MD  amoxicillin -clavulanate (AUGMENTIN ) 875-125 MG tablet Take 1 tablet by mouth every 12 (twelve) hours. 06/29/22   Emil Share, DO  aspirin  81 MG tablet Take 81 mg by mouth daily.      [provider]  bisacodyl (DULCOLAX) 5 MG EC tablet Take 5 mg by mouth daily as needed.      [provider]  carvedilol (COREG) 25 MG tablet Take 50 mg by mouth daily.      [provider]  clonazePAM  (KLONOPIN ) 0.5 MG tablet Take one tablet by mouth twice daily 04/28/13   Reed, Tiffany L, DO  cyclobenzaprine  (FLEXERIL ) 5 MG tablet Take 1 tablet (5 mg total) by mouth 2 (two) times daily as needed for up to 10 doses for muscle spasms. 05/01/20   Curatolo,  Adam, DO  docusate sodium  (COLACE) 100 MG capsule Take 1 capsule (100 mg total) by mouth 2 (two) times daily. 09/13/20   Curatolo, Adam, DO  eszopiclone (LUNESTA) 2 MG TABS tablet Take 2 mg by mouth at bedtime as needed for sleep. Take immediately before bedtime    [provider]  famotidine (PEPCID) 20 MG tablet Take 20 mg by mouth 2 (two) times daily.      [provider]  furosemide  (LASIX ) 40 MG tablet Take 40 mg by mouth.    [provider]  levothyroxine (SYNTHROID, LEVOTHROID) 150 MCG tablet Take 137 mcg by mouth every morning.     [provider]  LORazepam  (ATIVAN ) 0.5 MG tablet Take 1 tablet (0.5 mg total) by mouth at bedtime. 11/04/19   Couture, Cortni S, PA-C  losartan (COZAAR) 100 MG tablet Take 100 mg by mouth daily.    [provider]  losartan-hydrochlorothiazide (HYZAAR) 50-12.5 MG per tablet Take 1 tablet by mouth every morning.      [provider]  meclizine  (ANTIVERT ) 25 MG tablet Take 1 tablet (25 mg total) by mouth 3 (three) times daily as needed for dizziness. 06/29/22   Emil Share, DO  methylPREDNISolone  (MEDROL  DOSEPAK) 4 MG TBPK tablet Follow package insert 05/01/20   Curatolo, Adam, DO  nitrofurantoin, macrocrystal-monohydrate, (MACROBID) 100 MG capsule Take 100  mg by mouth 2 (two) times daily.    [provider]  omeprazole (PRILOSEC OTC) 20 MG tablet Take 20 mg by mouth daily.      [provider]  OVER THE COUNTER MEDICATION Take 2 tablets by mouth daily. Pressur-Lo multivitamin, mineral, and herb supplement to support cardiovascular health     [provider]  pantoprazole (PROTONIX) 40 MG tablet Take 40 mg by mouth daily.    [provider]  perphenazine (TRILAFON) 4 MG tablet Take 4 mg by mouth 2 (two) times daily.    [provider]  polyethylene glycol (MIRALAX  / GLYCOLAX ) 17 g packet Take 17 g by mouth daily. 09/13/20   Curatolo, Adam, DO  potassium chloride (KLOR-CON)  10 MEQ CR tablet Take 10 mEq by mouth 3 (three) times daily.      [provider]  Rivaroxaban (XARELTO PO) Take by mouth.    [provider]  solifenacin (VESICARE) 5 MG tablet Take 5 mg by mouth daily.      [provider]  sucralfate  (CARAFATE ) 1 GM/10ML suspension Take 10 mLs (1 g total) by mouth 4 (four) times daily -  with meals and at bedtime. 09/30/19   Layden, Lindsey A, PA-C  Tamsulosin HCl (FLOMAX) 0.4 MG CAPS Take 0.4 mg by mouth daily.      [provider]  traMADol  (ULTRAM ) 50 MG tablet Take 1 tablet (50 mg total) by mouth every 6 (six) hours as needed. 06/15/19   Geroldine Berg, MD  Warfarin Sodium (COUMADIN PO) Take by mouth.    [provider]  zolpidem (AMBIEN) 5 MG tablet Take 5 mg by mouth at bedtime as needed. For sleep     [provider]      Allergies    Chlorpheniramine-acetaminophen    Review of Systems   Review of Systems  All other systems reviewed and are negative.   Physical Exam Updated Vital Signs BP 122/70   Pulse 66   Temp 97.6 F (36.4 C) (Oral)   Resp 16   Wt 97.5 kg   SpO2 99%   BMI 38.09 kg/m  Physical Exam Vitals and nursing note reviewed.  Constitutional:      Appearance: She is well-developed.  HENT:     Head: Atraumatic.  Cardiovascular:     Rate and Rhythm: Normal rate.  Pulmonary:     Effort: Pulmonary effort is normal.  Abdominal:     Tenderness: There is abdominal tenderness.     Comments: Right upper quadrant tenderness with negative Murphy's.  Patient has mild right lower quadrant tenderness with negative McBurney's  Musculoskeletal:     Cervical back: Normal range of motion and neck supple.  Skin:    General: Skin is warm and dry.  Neurological:     Mental Status: She is alert and oriented to person, place, and time.     ED Results / Procedures / Treatments   Labs (all labs ordered are listed, but only abnormal results are displayed) Labs Reviewed  CBC - Abnormal;  Notable for the following components:      Result Value   WBC 3.7 (*)    RDW 16.3 (*)    Platelets 142 (*)    All other components within normal limits  BASIC METABOLIC PANEL - Abnormal; Notable for the following components:   Glucose, Bld 102 (*)    Calcium 8.5 (*)    GFR, Estimated 57 (*)    All other components within normal  limits  URINALYSIS, ROUTINE W REFLEX MICROSCOPIC - Abnormal; Notable for the following components:   Hgb urine dipstick TRACE (*)    Leukocytes,Ua TRACE (*)    All other components within normal limits  URINALYSIS, MICROSCOPIC (REFLEX) - Abnormal; Notable for the following components:   Bacteria, UA FEW (*)    All other components within normal limits  HEPATIC FUNCTION PANEL  LIPASE, BLOOD    EKG None  Radiology CT ABDOMEN PELVIS W CONTRAST Result Date: 05/24/2023 CLINICAL DATA:  Dysuria right lower quadrant and right upper quadrant pain EXAM: CT ABDOMEN AND PELVIS WITH CONTRAST TECHNIQUE: Multidetector CT imaging of the abdomen and pelvis was performed using the standard protocol following bolus administration of intravenous contrast. RADIATION DOSE REDUCTION: This exam was performed according to the departmental dose-optimization program which includes automated exposure control, adjustment of the mA and/or kV according to patient size and/or use of iterative reconstruction technique. CONTRAST:  OMNIPAQUE  IOHEXOL  300 MG/ML  SOLN COMPARISON:  CT 05/02/2021 FINDINGS: Lower chest: Lung bases demonstrate no acute airspace disease. Hepatobiliary: Gallstones. No biliary dilatation. No focal hepatic abnormality. Pancreas: Unremarkable. No pancreatic ductal dilatation or surrounding inflammatory changes. Spleen: Normal in size without focal abnormality. Adrenals/Urinary Tract: Adrenal glands are unremarkable. Kidneys are normal, without renal calculi, focal lesion, or hydronephrosis. Bladder is unremarkable. Stomach/Bowel: Stomach is within normal limits. Appendix  appears normal. No evidence of bowel wall thickening, distention, or inflammatory changes. Vascular/Lymphatic: Mild aortic atherosclerosis. No aneurysm. No suspicious lymph nodes Reproductive: Calcified uterine fibroids.  No adnexal mass Other: Negative for pelvic effusion or free air. Musculoskeletal: No acute or suspicious osseous abnormality. Advanced bilateral hip degenerative change. Multilevel degenerative changes of the spine. IMPRESSION: 1. No CT evidence for acute intra-abdominal or pelvic abnormality. 2. Gallstones. 3. Calcified uterine fibroids. 4. Aortic atherosclerosis. Aortic Atherosclerosis (ICD10-I70.0). Electronically Signed   By: Luke Bun M.D.   On: 05/24/2023 15:15    Procedures Procedures    Medications Ordered in ED Medications  iohexol  (OMNIPAQUE ) 300 MG/ML solution 100 mL (100 mLs Intravenous Contrast Given 05/24/23 1420)    ED Course/ Medical Decision Making/ A&P                                 Medical Decision Making Amount and/or Complexity of Data Reviewed Labs: ordered. Radiology: ordered.  Risk Prescription drug management.   This patient presents to the ED with chief complaint(s) of abdominal pain with pertinent past medical history of gallstones, UTI and no previous abdominal surgeries.The complaint involves an extensive differential diagnosis and also carries with it a high risk of complications and morbidity.    The differential diagnosis includes : Cholelithiasis, abdominal mass, cystitis, diverticulitis/intra-abdominal infection, ileus, constipation.  Although my suspicion is that the pain is gallstone related, in the setting of patient having known cholelithiasis and her pain upon inquiry being actually right upper quadrant than lower quadrant -we will get CT scan given the ambiguity in history and high risk factors that patient carries.  The initial plan is to get basic labs, UA, CT abdomen and pelvis with contrast.   Additional history  obtained: Records reviewed  previous imaging including CT scans -that it showed gallstones in the past.  Independent labs interpretation:  The following labs were independently interpreted: CBC, CMP, urine analysis are all reassuring.  UA not showing any signs of infection.  Clinically, patient does not have UTI.  Independent visualization and interpretation of  imaging: - I independently visualized the following imaging with scope of interpretation limited to determining acute life threatening conditions related to emergency care: CT scan, which revealed no evidence of perforation.  Treatment and Reassessment: CT scan shows gallstones.  On repeat evaluation, patient continues to point to right upper quadrant as area of pain.  She is currently pain-free.  I think it might be worthwhile for patient to follow-up with general surgery for evaluation of gallstones with these intermittent abdominal pain in the right upper quadrant.   Final Clinical Impression(s) / ED Diagnoses Final diagnoses:  Calculus of gallbladder without cholecystitis without obstruction    Rx / DC Orders ED Discharge Orders     None         Charlyn Sora, MD 05/25/23 [phone]

## 2023-05-24 NOTE — ED Triage Notes (Signed)
 Dysuria x 1 day, RLQ pain . Hx UTI . No hematuria

## 2023-05-31 ENCOUNTER — Ambulatory Visit: Payer: Self-pay | Admitting: Surgery

## 2023-07-13 ENCOUNTER — Ambulatory Visit: Payer: Self-pay | Admitting: Surgery

## 2023-08-02 ENCOUNTER — Encounter (HOSPITAL_COMMUNITY): Payer: Self-pay

## 2023-08-02 NOTE — Pre-Procedure Instructions (Signed)
 Surgical Instructions   Your procedure is scheduled on Thursday, August 05, 2023. Report to [facility] Main Entrance "A" at 7:00 A.M., then check in with the Admitting office. Any questions or running late day of surgery: call [phone]  Questions prior to your surgery date: call [phone], Monday-Friday, 8am-4pm. If you experience any cold or flu symptoms such as cough, fever, chills, shortness of breath, etc. between now and your scheduled surgery, please notify us at the above number.     Remember:  Do not eat after midnight the night before your surgery   You may drink clear liquids until 6:00am the morning of your surgery.   Clear liquids allowed are: Water, Non-Citrus Juices (without pulp), Carbonated Beverages, Clear Tea (no milk, honey, etc.), Black Coffee Only (NO MILK, CREAM OR POWDERED CREAMER of any kind), and Gatorade.    Take these medicines the morning of surgery with A SIP OF WATER : Amlodipine (Norvasc) Carvedilol (Coreg) Levothyroxine (Synthroid) Pantoprazole (Protonix) Perphenazine (Trilafon) Tamsulosin HCL (Flomax)  May take these medicines IF NEEDED: Cetirizine (Zyrtec) Fluticasone (Flonase)  Follow your surgeon's instructions when to STOP Aspirin.  If no instructions were given by your surgeon, then you will need to call the office to obtain instructions.   One week prior to surgery, STOP taking any Aleve, Naproxen, Ibuprofen, Motrin, Advil, Goody's, BC's, all herbal medications, fish oil, and non-prescription vitamins.                     Do NOT Smoke (Tobacco/Vaping) for 24 hours prior to your procedure.  If you use a CPAP at night, you may bring your mask/headgear for your overnight stay.   You will be asked to remove any contacts, glasses, piercing's, hearing aid's, dentures/partials prior to surgery. Please bring cases for these items if needed.    Patients discharged the day of surgery will not be allowed to drive home, and someone needs to  stay with them for 24 hours.  SURGICAL WAITING ROOM VISITATION Patients may have no more than 2 support people in the waiting area - these visitors may rotate.   Pre-op nurse will coordinate an appropriate time for 1 ADULT support person, who may not rotate, to accompany patient in pre-op.  Children under the age of 34 must have an adult with them who is not the patient and must remain in the main waiting area with an adult.  If the patient needs to stay at the hospital during part of their recovery, the visitor guidelines for inpatient rooms apply.  Please refer to the [facility] website for the visitor guidelines for any additional information.   If you received a COVID test during your pre-op visit  it is requested that you wear a mask when out in public, stay away from anyone that may not be feeling well and notify your surgeon if you develop symptoms. If you have been in contact with anyone that has tested positive in the last 10 days please notify you surgeon.      Pre-operative CHG Bathing Instructions   You can play a key role in reducing the risk of infection after surgery. Your skin needs to be as free of germs as possible. You can reduce the number of germs on your skin by washing with CHG (chlorhexidine gluconate) soap before surgery. CHG is an antiseptic soap that kills germs and continues to kill germs even after washing.   DO NOT use if you have an allergy to chlorhexidine/CHG or  antibacterial soaps. If your skin becomes reddened or irritated, stop using the CHG and notify one of our RNs at [phone].              TAKE A SHOWER THE NIGHT BEFORE SURGERY AND THE DAY OF SURGERY    Please keep in mind the following:  DO NOT shave, including legs and underarms, 48 hours prior to surgery.   You may shave your face before/day of surgery.  Place clean sheets on your bed the night before surgery Use a clean washcloth (not used since being washed) for each shower. DO NOT sleep  with pet's night before surgery.  CHG Shower Instructions:  Wash your face and private area with normal soap. If you choose to wash your hair, wash first with your normal shampoo.  After you use shampoo/soap, rinse your hair and body thoroughly to remove shampoo/soap residue.  Turn the water OFF and apply half the bottle of CHG soap to a CLEAN washcloth.  Apply CHG soap ONLY FROM YOUR NECK DOWN TO YOUR TOES (washing for 3-5 minutes)  DO NOT use CHG soap on face, private areas, open wounds, or sores.  Pay special attention to the area where your surgery is being performed.  If you are having back surgery, having someone wash your back for you may be helpful. Wait 2 minutes after CHG soap is applied, then you may rinse off the CHG soap.  Pat dry with a clean towel  Put on clean pajamas    Additional instructions for the day of surgery: DO NOT APPLY any lotions, deodorants, cologne, or perfumes.   Do not wear jewelry or makeup Do not wear nail polish, gel polish, artificial nails, or any other type of covering on natural nails (fingers and toes) Do not bring valuables to the hospital. [facility] is not responsible for valuables/personal belongings. Put on clean/comfortable clothes.  Please brush your teeth.  Ask your nurse before applying any prescription medications to the skin.

## 2023-08-03 ENCOUNTER — Other Ambulatory Visit: Payer: Self-pay

## 2023-08-03 ENCOUNTER — Encounter (HOSPITAL_COMMUNITY)
Admission: RE | Admit: 2023-08-03 | Discharge: 2023-08-03 | Disposition: A | Discharge status: Home / Self Care | Source: Ambulatory Visit | Attending: Surgery | Admitting: Surgery

## 2023-08-03 ENCOUNTER — Encounter (HOSPITAL_COMMUNITY): Payer: Self-pay

## 2023-08-03 VITALS — BP 120/66 | HR 67 | Temp 98.0°F | Resp 18 | Ht 63.5 in | Wt 206.0 lb

## 2023-08-03 DIAGNOSIS — Z01812 Encounter for preprocedural laboratory examination: Secondary | ICD-10-CM | POA: Diagnosis present

## 2023-08-03 DIAGNOSIS — Z8673 Personal history of transient ischemic attack (TIA), and cerebral infarction without residual deficits: Secondary | ICD-10-CM | POA: Diagnosis not present

## 2023-08-03 DIAGNOSIS — E119 Type 2 diabetes mellitus without complications: Secondary | ICD-10-CM | POA: Insufficient documentation

## 2023-08-03 DIAGNOSIS — Z01818 Encounter for other preprocedural examination: Secondary | ICD-10-CM

## 2023-08-03 DIAGNOSIS — I10 Essential (primary) hypertension: Secondary | ICD-10-CM | POA: Diagnosis not present

## 2023-08-03 DIAGNOSIS — E890 Postprocedural hypothyroidism: Secondary | ICD-10-CM | POA: Diagnosis not present

## 2023-08-03 HISTORY — DX: Unspecified osteoarthritis, unspecified site: M19.90

## 2023-08-03 HISTORY — DX: Hyperlipidemia, unspecified: E78.5

## 2023-08-03 HISTORY — DX: Type 2 diabetes mellitus without complications: E11.9

## 2023-08-03 HISTORY — DX: Supraventricular tachycardia, unspecified: I47.10

## 2023-08-03 HISTORY — DX: Nausea with vomiting, unspecified: Z98.890

## 2023-08-03 HISTORY — DX: Dizziness and giddiness: R42

## 2023-08-03 LAB — BASIC METABOLIC PANEL
Anion gap: 7 (ref 5–15)
BUN: 12 mg/dL (ref 8–23)
CO2: 26 mmol/L (ref 22–32)
Calcium: 9 mg/dL (ref 8.9–10.3)
Chloride: 107 mmol/L (ref 98–111)
Creatinine, Ser: 0.86 mg/dL (ref 0.44–1.00)
GFR, Estimated: >60 mL/min (ref >60)
Glucose, Bld: 98 mg/dL (ref 70–99)
Potassium: 4 mmol/L (ref 3.5–5.1)
Sodium: 140 mmol/L (ref 135–145)

## 2023-08-03 LAB — HEMOGLOBIN A1C
Hgb A1c MFr Bld: 5.7 % — ABNORMAL HIGH (ref 4.8–5.6)
Mean Plasma Glucose: 116.89 mg/dL

## 2023-08-03 LAB — CBC
HCT: 40.5 % (ref 36.0–46.0)
Hemoglobin: 13.6 g/dL (ref 12.0–15.0)
MCH: 31.1 pg (ref 26.0–34.0)
MCHC: 33.6 g/dL (ref 30.0–36.0)
MCV: 92.5 fL (ref 80.0–100.0)
Platelets: 181 10*3/uL (ref 150–400)
RBC: 4.38 MIL/uL (ref 3.87–5.11)
RDW: 14.6 % (ref 11.5–15.5)
WBC: 3.7 10*3/uL — ABNORMAL LOW (ref 4.0–10.5)
nRBC: 0 % (ref 0.0–0.2)

## 2023-08-03 LAB — GLUCOSE, CAPILLARY: Glucose-Capillary: 89 mg/dL (ref 70–99)

## 2023-08-03 NOTE — Progress Notes (Signed)
 PCP - Henri Medal Cardiologist - Dr. Kingsley Callander at [facility]  PPM/ICD - denies Device Orders - n/a Rep Notified - n/a  Chest x-ray - denies EKG - 07/14/23 - requested tracing Stress Test - 02/10/22 - CE ECHO - 01/12/19 - CE Cardiac Cath - denies  Sleep Study - denies CPAP - n/a  Fasting Blood Sugar - patient states that her diabetes is diet controlled and she does not check it at home     Last dose of GLP1 agonist-  n/a GLP1 instructions: n/a  Blood Thinner Instructions: n/a Aspirin Instructions:  patient has not received instructions when to stop Aspirin.  Called Dr. Rosezena Sensor office while patient was in PAT and Joni Reining stated that she will follow up with patient for Aspirin instructions.  Patient also stated that she was using [employer] transportation to and from hospital.  Patient stated that she was hoping she was spending the night.  Patient did say that she lives at home with her daughter but she was trying to set up some home care for after surgery.   Patient stated that she will reach out to her son to see if he can drive her to and from surgery.   I did inform Joni Reining at Dr. Rosezena Sensor office that patient was requesting to spend the night and could possibly benefit from this as there appeared to concern about care at home after surgery.    ERAS Protcol - clears until 0600 PRE-SURGERY Ensure or G2- n/a  COVID TEST- no   Anesthesia review: yes - cardiac clearance  Patient denies shortness of breath, fever, cough and chest pain at PAT appointment   All instructions explained to the patient, with a verbal understanding of the material. Patient agrees to go over the instructions while at home for a better understanding. Patient also instructed to self quarantine after being tested for COVID-19. The opportunity to ask questions was provided.  Teach back was used to confirm date and arrival time.  Patient stated that she understood what medications to take  and is aware that the office would be calling about her aspirin instructions.

## 2023-08-04 NOTE — Anesthesia Preprocedure Evaluation (Signed)
 Anesthesia Evaluation    Airway        Dental   Pulmonary           Cardiovascular hypertension,      Neuro/Psych    GI/Hepatic   Endo/Other  diabetes    Renal/GU      Musculoskeletal   Abdominal   Peds  Hematology   Anesthesia Other Findings   Reproductive/Obstetrics                              Anesthesia Physical Anesthesia Plan  ASA:   Anesthesia Plan:    Post-op Pain Management:    Induction:   PONV Risk Score and Plan:   Airway Management Planned:   Additional Equipment:   Intra-op Plan:   Post-operative Plan:   Informed Consent:   Plan Discussed with:   Anesthesia Plan Comments: (PAT note by Antionette Poles, PA-C: 83 year old female with cardiology at [facility] for history of HTN, TIA, HLD, prior chest pain. NM stress test 02/10/2022 noted anterior wall defect with very mild redistribution at the apex; suspect likely breast artifact but ischemia cannot be excluded. TTE 01/12/2019 noted LVEF 50-55%. Normal LV size and wall motion. Grade 1 diastolic dysfunction. Sigmoid deformity of the ventricular septum. Trace MR/TR. 30-day monitor 07/2021 showed 19 episodes of atrial tachycardia lasting 4-20 beats with average rate 113 bpm.  She was last seen 07/14/2023 for preop evaluation.  Per note, "-The patient has had some abnormalities on cardiac testing including slightly abnormal nuclear medicine stress test although no definitive evidence of reversible ischemia, and SVT noted on Zio patch thus she would be at increased but acceptable risk to move forward with surgical intervention as long as both the patient and surgeon are willing to assume additional risk. Advise cardiac monitoring during procedure as she will be at higher risk of tacky dysrhythmias. The patient is asymptomatic from a cardiac perspective and does not require additional cardiac testing at this time."  Other pertinent history  includes diet-controlled DM2, hypothyroid s/p thyroidectomy, GERD, postoperative nausea and vomiting.  Preop labs reviewed, mild chronic leukocytopenia with WBC 3.7, otherwise WNL.  EKG 07/14/2023 (Care Everywhere): Sinus rhythm.  Rate 69.  LAD.  LVH.  Nuclear stress 02/10/2022 (Care Everywhere): IMPRESSION  Anterior wall defect with very mild redistribution at the apical portion,  suspect likely breast artifact but ischemia cannot be excluded.  Normal left ventricular function with ejection fraction calculated at 66 %   TTE 01/12/2019 (Care Everywhere): Conclusions  Summary  No significant change vs previous echo report  TDS  Ejection fraction is visually estimated at 50-55%.  Normal left ventricular systolic function with no appreciable segmental  abnormality.  Grade I diastolic dysfunction.  Sigmoid deformity of the ventricular septum  Normal right ventricular size and function.  RVSP = .   )         Anesthesia Quick Evaluation

## 2023-08-04 NOTE — Progress Notes (Signed)
 Anesthesia Chart Review:  83 year old female with cardiology at [facility] for history of HTN, TIA, HLD, prior chest pain. NM stress test 02/10/2022 noted anterior wall defect with very mild redistribution at the apex; suspect likely breast artifact but ischemia cannot be excluded. TTE 01/12/2019 noted LVEF 50-55%. Normal LV size and wall motion. Grade 1 diastolic dysfunction. Sigmoid deformity of the ventricular septum. Trace MR/TR. 30-day monitor 07/2021 showed 19 episodes of atrial tachycardia lasting 4-20 beats with average rate 113 bpm.  She was last seen 07/14/2023 for preop evaluation.  Per note, "-The patient has had some abnormalities on cardiac testing including slightly abnormal nuclear medicine stress test although no definitive evidence of reversible ischemia, and SVT noted on Zio patch thus she would be at increased but acceptable risk to move forward with surgical intervention as long as both the patient and surgeon are willing to assume additional risk. Advise cardiac monitoring during procedure as she will be at higher risk of tacky dysrhythmias. The patient is asymptomatic from a cardiac perspective and does not require additional cardiac testing at this time."  Other pertinent history includes diet-controlled DM2, hypothyroid s/p thyroidectomy, GERD, postoperative nausea and vomiting.  Preop labs reviewed, mild chronic leukocytopenia with WBC 3.7, otherwise WNL.  EKG 07/14/2023 (Care Everywhere): Sinus rhythm.  Rate 69.  LAD.  LVH.  Nuclear stress 02/10/2022 (Care Everywhere): IMPRESSION  Anterior wall defect with very mild redistribution at the apical portion,  suspect likely breast artifact but ischemia cannot be excluded.  Normal left ventricular function with ejection fraction calculated at 66 %   TTE 01/12/2019 (Care Everywhere): Conclusions  Summary  No significant change vs previous echo report  TDS  Ejection fraction is visually estimated at 50-55%.  Normal left ventricular  systolic function with no appreciable segmental  abnormality.  Grade I diastolic dysfunction.  Sigmoid deformity of the ventricular septum  Normal right ventricular size and function.  RVSP = .     Zannie Cove [facility] Stay [facility]/Anesthesiology Phone [phone] 08/04/2023 10:15 AM

## 2023-08-05 ENCOUNTER — Encounter (HOSPITAL_COMMUNITY): Admission: RE | Payer: Self-pay | Source: Home / Self Care

## 2023-08-05 ENCOUNTER — Encounter (HOSPITAL_COMMUNITY): Payer: Self-pay | Admitting: Physician Assistant

## 2023-08-05 ENCOUNTER — Ambulatory Visit (HOSPITAL_COMMUNITY): Admission: RE | Admit: 2023-08-05 | Payer: Medicare HMO | Source: Home / Self Care | Admitting: Surgery

## 2023-08-05 SURGERY — LAPAROSCOPIC CHOLECYSTECTOMY
Anesthesia: General

## 2024-04-23 ENCOUNTER — Emergency Department (HOSPITAL_BASED_OUTPATIENT_CLINIC_OR_DEPARTMENT_OTHER)
Admission: EM | Admit: 2024-04-23 | Discharge: 2024-04-23 | Disposition: A | Discharge status: Home / Self Care | Attending: Emergency Medicine | Admitting: Emergency Medicine

## 2024-04-23 ENCOUNTER — Encounter (HOSPITAL_BASED_OUTPATIENT_CLINIC_OR_DEPARTMENT_OTHER): Payer: Self-pay | Admitting: Emergency Medicine

## 2024-04-23 ENCOUNTER — Emergency Department (HOSPITAL_BASED_OUTPATIENT_CLINIC_OR_DEPARTMENT_OTHER)

## 2024-04-23 ENCOUNTER — Other Ambulatory Visit: Payer: Self-pay

## 2024-04-23 DIAGNOSIS — I10 Essential (primary) hypertension: Secondary | ICD-10-CM | POA: Insufficient documentation

## 2024-04-23 DIAGNOSIS — R42 Dizziness and giddiness: Secondary | ICD-10-CM | POA: Insufficient documentation

## 2024-04-23 DIAGNOSIS — E119 Type 2 diabetes mellitus without complications: Secondary | ICD-10-CM | POA: Insufficient documentation

## 2024-04-23 DIAGNOSIS — Z79899 Other long term (current) drug therapy: Secondary | ICD-10-CM | POA: Insufficient documentation

## 2024-04-23 DIAGNOSIS — Z8673 Personal history of transient ischemic attack (TIA), and cerebral infarction without residual deficits: Secondary | ICD-10-CM | POA: Insufficient documentation

## 2024-04-23 DIAGNOSIS — R519 Headache, unspecified: Secondary | ICD-10-CM | POA: Insufficient documentation

## 2024-04-23 DIAGNOSIS — Z7982 Long term (current) use of aspirin: Secondary | ICD-10-CM | POA: Insufficient documentation

## 2024-04-23 LAB — CBC WITH DIFFERENTIAL/PLATELET
Abs Immature Granulocytes: 0.01 K/uL (ref 0.00–0.07)
Basophils Absolute: 0 K/uL (ref 0.0–0.1)
Basophils Relative: 1 %
Eosinophils Absolute: 0.1 K/uL (ref 0.0–0.5)
Eosinophils Relative: 4 %
HCT: 37.6 % (ref 36.0–46.0)
Hemoglobin: 12.5 g/dL (ref 12.0–15.0)
Immature Granulocytes: 0 %
Lymphocytes Relative: 32 %
Lymphs Abs: 1.1 K/uL (ref 0.7–4.0)
MCH: 31.3 pg (ref 26.0–34.0)
MCHC: 33.2 g/dL (ref 30.0–36.0)
MCV: 94 fL (ref 80.0–100.0)
Monocytes Absolute: 0.5 K/uL (ref 0.1–1.0)
Monocytes Relative: 14 %
Neutro Abs: 1.7 K/uL (ref 1.7–7.7)
Neutrophils Relative %: 49 %
Platelets: 185 K/uL (ref 150–400)
RBC: 4 MIL/uL (ref 3.87–5.11)
RDW: 16.1 % — ABNORMAL HIGH (ref 11.5–15.5)
WBC: 3.5 K/uL — ABNORMAL LOW (ref 4.0–10.5)
nRBC: 0 % (ref 0.0–0.2)

## 2024-04-23 LAB — COMPREHENSIVE METABOLIC PANEL WITH GFR
ALT: 11 U/L (ref 0–44)
AST: 22 U/L (ref 15–41)
Albumin: 3.8 g/dL (ref 3.5–5.0)
Alkaline Phosphatase: 56 U/L (ref 38–126)
Anion gap: 9 (ref 5–15)
BUN: 8 mg/dL (ref 8–23)
CO2: 27 mmol/L (ref 22–32)
Calcium: 9 mg/dL (ref 8.9–10.3)
Chloride: 104 mmol/L (ref 98–111)
Creatinine, Ser: 0.76 mg/dL (ref 0.44–1.00)
GFR, Estimated: >60 mL/min (ref >60)
Glucose, Bld: 102 mg/dL — ABNORMAL HIGH (ref 70–99)
Potassium: 3.8 mmol/L (ref 3.5–5.1)
Sodium: 140 mmol/L (ref 135–145)
Total Bilirubin: 0.5 mg/dL (ref 0.0–1.2)
Total Protein: 6.4 g/dL — ABNORMAL LOW (ref 6.5–8.1)

## 2024-04-23 MED ORDER — ACETAMINOPHEN 500 MG PO TABS
1000.0000 mg | ORAL_TABLET | Freq: Once | ORAL | Status: AC
  Administered 2024-04-23: 1000 mg via ORAL
  Filled 2024-04-23: qty 2

## 2024-04-23 MED ORDER — IOHEXOL 350 MG/ML SOLN
75.0000 mL | Freq: Once | INTRAVENOUS | Status: AC | PRN
  Administered 2024-04-23: 75 mL via INTRAVENOUS

## 2024-04-23 NOTE — ED Triage Notes (Signed)
 Pt reports she felt BP was elevated (did not check) since Monday because she has been having dizziness and HAs; BP meds recently reduced d/t syncopal episode 1 month ago, but she returned to previous doses on Mon
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
## 2024-04-23 NOTE — ED Notes (Signed)
 ED Provider at bedside.
